# Patient Record
Sex: Female | Born: 1950 | Race: White | Hispanic: No | Marital: Married | Smoking: Former smoker
Health system: Western US, Academic
[De-identification: ages and names within clinical notes are randomized; demographics above are authoritative.]

## PROBLEM LIST (undated history)

## (undated) HISTORY — PX: HYSTERECTOMY, RADICAL: SHX000829

---

## 2014-06-12 IMAGING — MG MAMMOGRAPHY SCREENING BILATERAL 3D TOMOSYNTHESIS WITH CAD
12 of 16 series · 12 of 32 positions shown · non-contrast
Comparison: 08/09/2012
BREAST DENSITY: (Level B) There are scattered areas of fibroglandular density.

MAMMOGRAPHY SCREENING 3D TOMOSYNTHESIS WITH CAD, 06/12/2014 [DATE]:
CLINICAL INDICATION: Screening exam.
TECHNIQUE: Digital mammograms and 3-D Tomosynthesis were obtained. These were
interpreted both primarily and with the aid of computer-aided detection
system.

[L CC synth-2D]
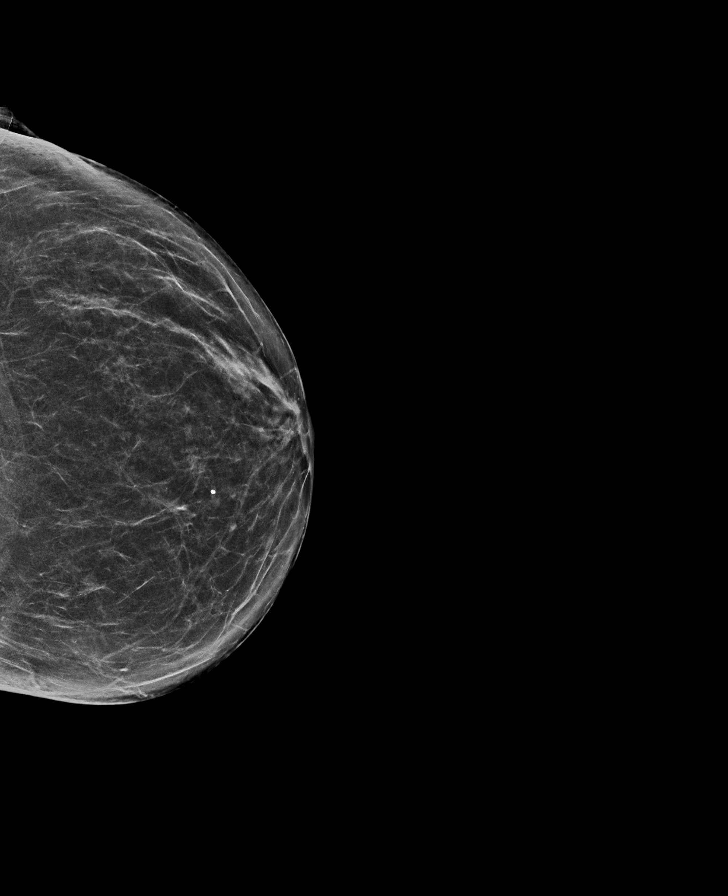

[R MLO]
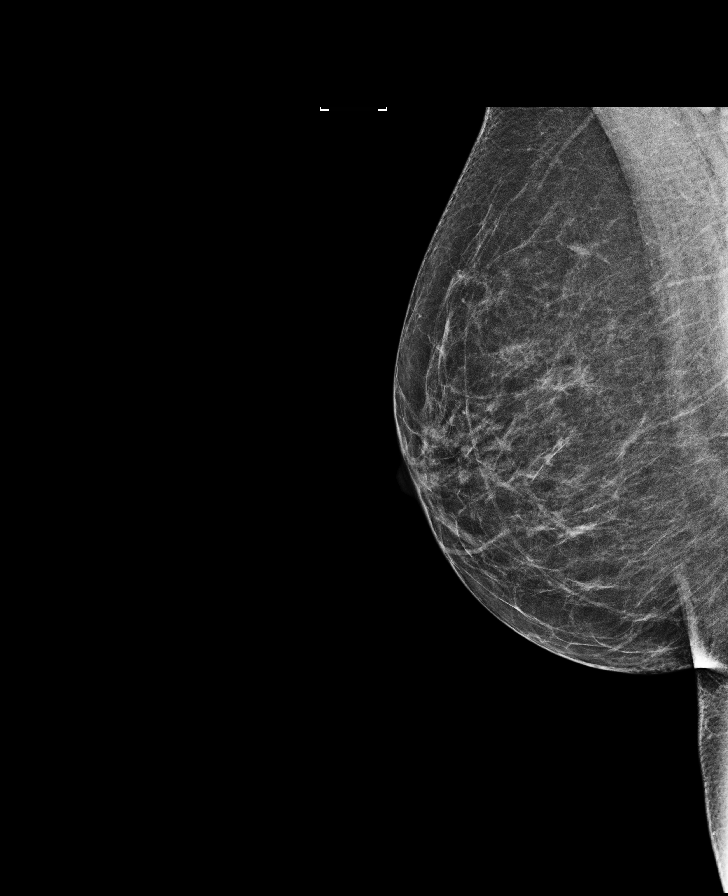

[L MLO synth-2D]
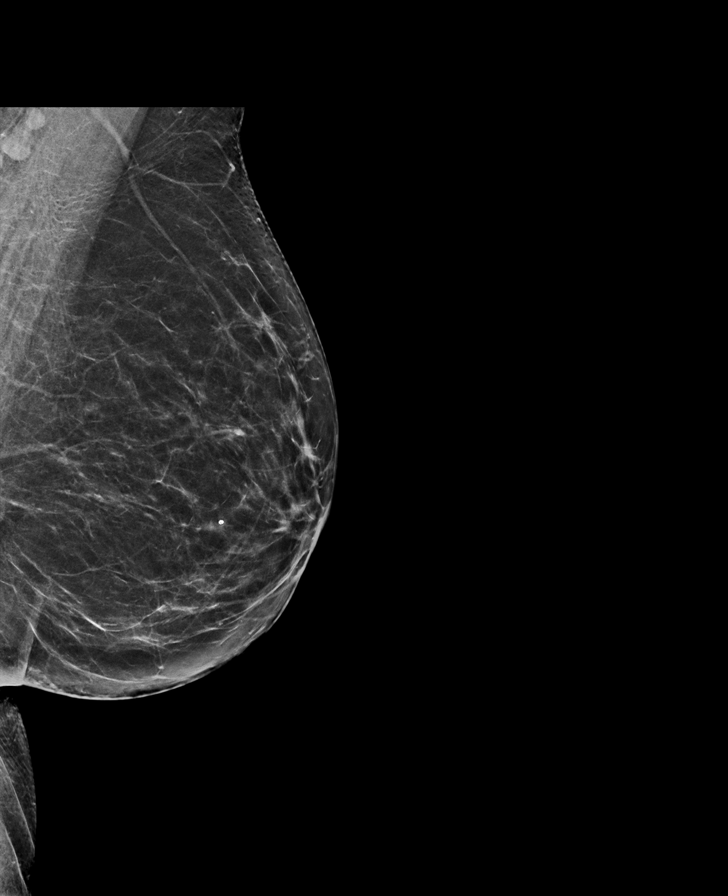

[R CC]
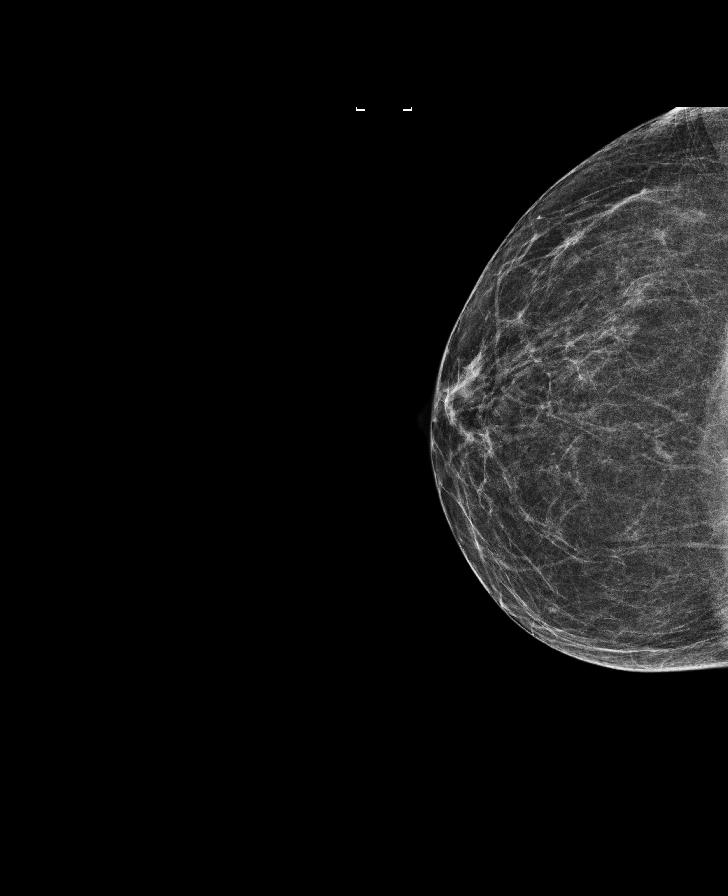

[L MLO]
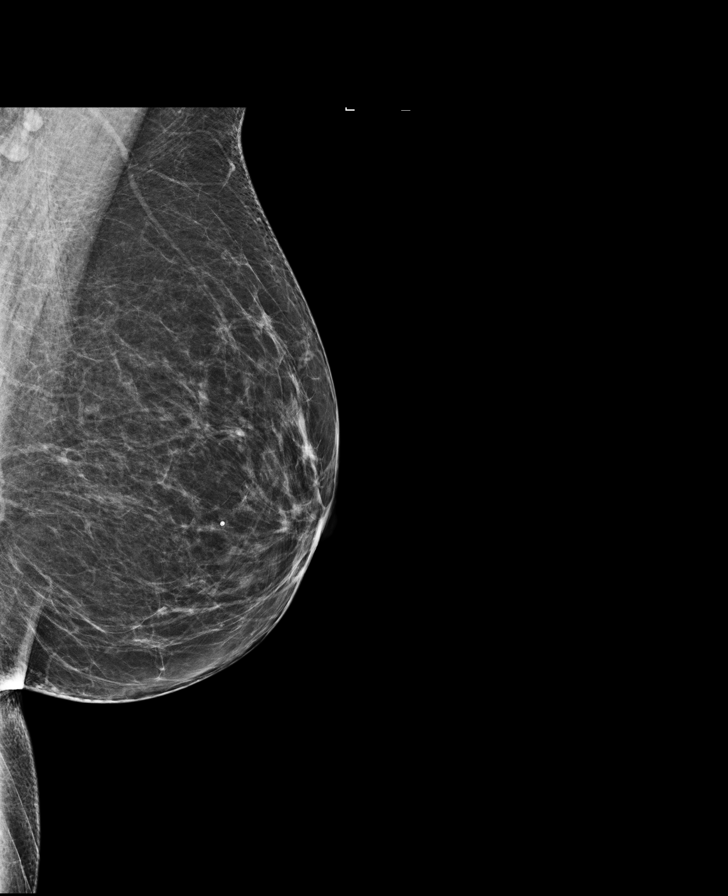

[R MLO synth-2D]
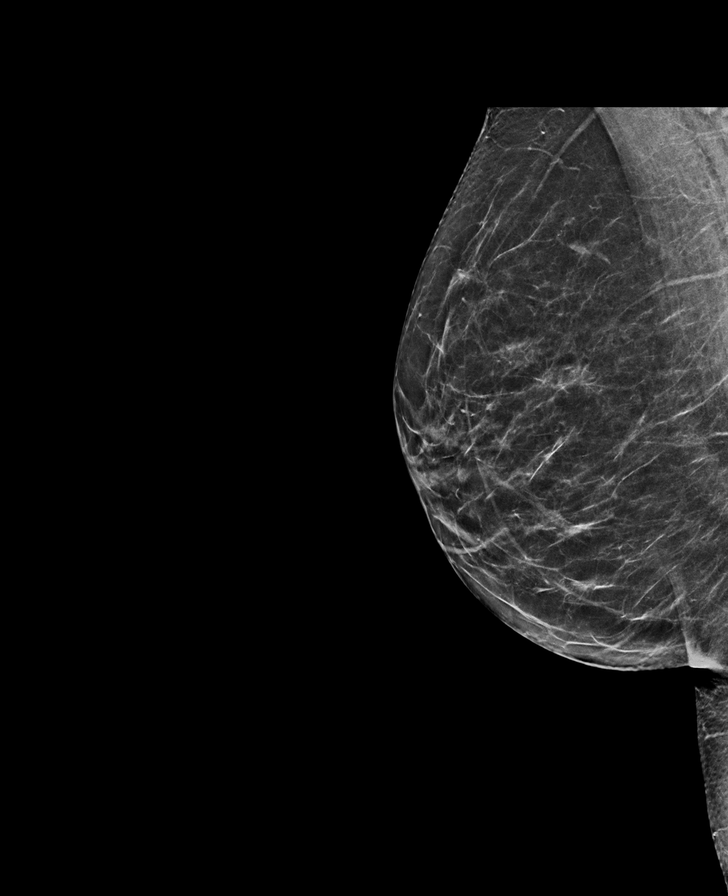

[L CC]
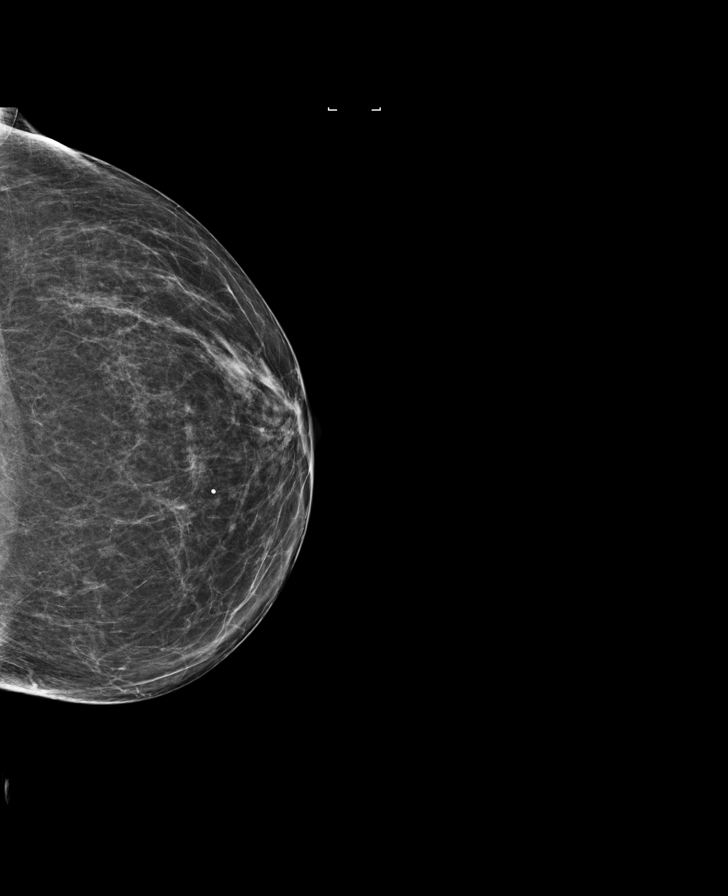

[R CC synth-2D]
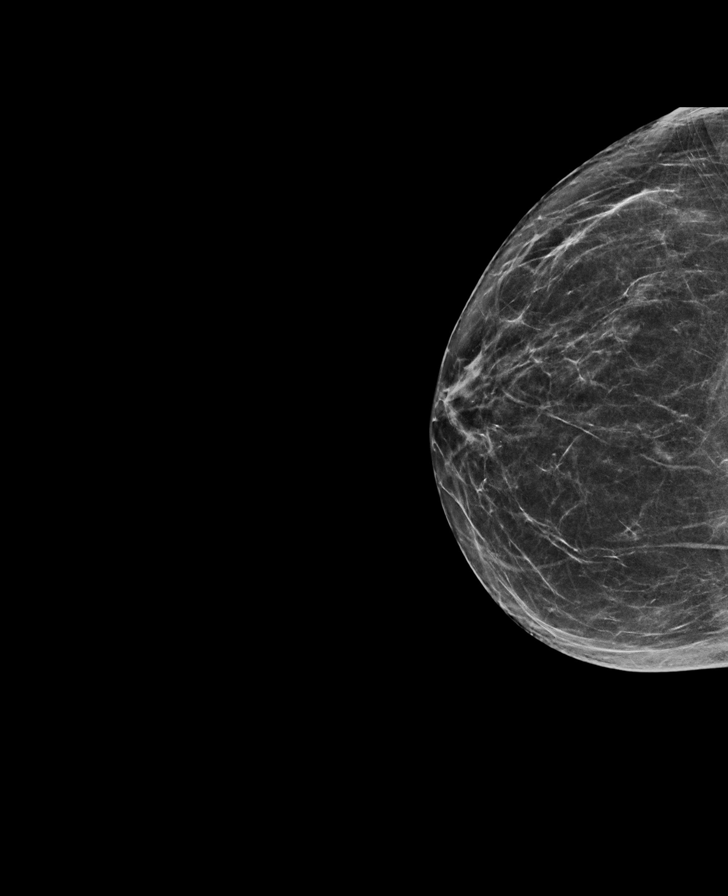

[L MLO tomo]
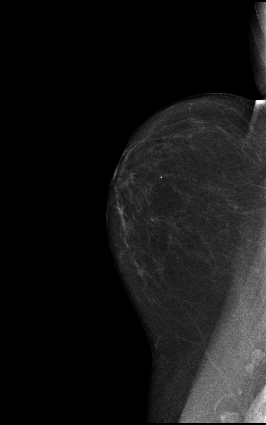

[R MLO tomo]
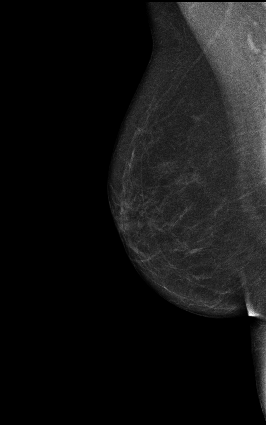

[L CC tomo]
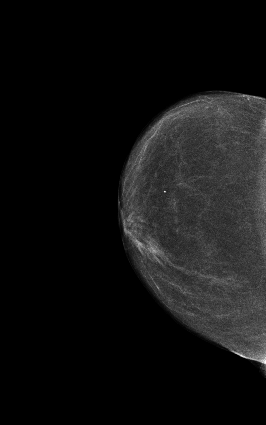

[R CC tomo]
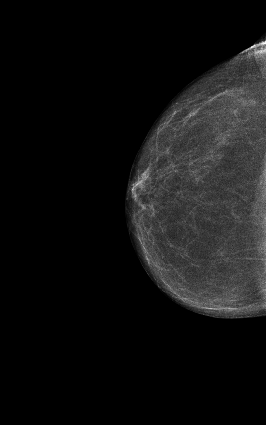

[12 of 32 positions shown; findings below may reference images not displayed]

FINDINGS: Benign type calcification left breast. No mass, area of architectural
distortion or suspicious calcification. No mammographic evidence of
malignancy.
IMPRESSION: ( BI-RADS 2) Benign findings. Routine mammographic follow-up is recommended.

## 2014-12-27 ENCOUNTER — Ambulatory Visit: Payer: 59 | Admitting: Internal Medicine

## 2014-12-27 ENCOUNTER — Encounter: Payer: Self-pay | Admitting: Internal Medicine

## 2014-12-27 VITALS — BP 112/72 | HR 72 | Temp 96.3°F | Ht 66.5 in | Wt 141.5 lb

## 2014-12-27 DIAGNOSIS — R630 Anorexia: Secondary | ICD-10-CM | POA: Insufficient documentation

## 2014-12-27 DIAGNOSIS — F418 Other specified anxiety disorders: Secondary | ICD-10-CM | POA: Insufficient documentation

## 2014-12-27 DIAGNOSIS — M810 Age-related osteoporosis without current pathological fracture: Secondary | ICD-10-CM | POA: Insufficient documentation

## 2014-12-27 DIAGNOSIS — G4700 Insomnia, unspecified: Secondary | ICD-10-CM | POA: Insufficient documentation

## 2014-12-27 DIAGNOSIS — E7800 Pure hypercholesterolemia, unspecified: Secondary | ICD-10-CM | POA: Insufficient documentation

## 2014-12-27 DIAGNOSIS — Z23 Encounter for immunization: Secondary | ICD-10-CM

## 2014-12-27 DIAGNOSIS — F4310 Post-traumatic stress disorder, unspecified: Secondary | ICD-10-CM | POA: Insufficient documentation

## 2014-12-27 MED ORDER — ALPRAZOLAM 0.25 MG TABLET
0.2500 mg | ORAL_TABLET | Freq: Every day | ORAL | 0 refills | Status: DC | PRN
Start: 2014-12-27 — End: 2015-02-12

## 2014-12-27 NOTE — Progress Notes (Signed)
IMPRESSION and PLAN:  (F41.8) Depression with anxiety  (primary encounter diagnosis)  Comment: uncontrolled but she could get into Psych right away  Plan: BusPIRone (BUSPAR) 30 mg tablet, Duloxetine         (CYMBALTA) 60 mg capsule, Alprazolam (XANAX)         0.25 mg Tablet Tablet        continue present medication(s) for now. Could consider retrying the Pristiq but she has a complicated hisptry so will defer to Psychiatry     (E78.0) High cholesterol  Comment: well controlled   Plan: Atorvastatin (LIPITOR) 20 mg Tablet        HYPERLIPIDEMIA  well controlled, no significant medication side effects noted.   current treatment plan is effective, no change in therapy, lab results and schedule of future lab studies reviewed with patient.  The patient is asked to make an attempt to improve diet and exercise patterns to aid in medical management of this problem.  I had a lengthy discussion of the side effects of the prescribed medications including serious symptoms to watch for as well as any routine laboratory monitoring required.  The patient will contact me immediately with any concerns.     (Z23) Flu vaccine need  Comment: I had a lengthy discussion of the side effects of the vaccine including serious symptoms to watch for. Minor symptoms include pain at the injection site, swelling and malaise.  The need for follow-up vaccination in 1 years was also discussed. The patient will contact me immediately with any concerns.   Plan: INFLUENZA VACCINE (3 YO AND OLDER),         QUADRIVALENT, NO LATEX, NO PRESERVATIVE          (F43.10) PTSD (post-traumatic stress disorder)    (R63.0) Anorexia    (G47.00) Insomnia  Comment: The problem of recurrent insomnia is discussed. Avoidance of caffeine sources is strongly encouraged. Sleep hygiene issues are reviewed. The use of sedative hypnotics for temporary relief is appropriate; we discussed the addictive nature of these drugs, and a one-time only prescription for prn use of a  hypnotic is given, to use no more than 3 times per week for 2-3 weeks.   Plan: advised low dose Xanax at night     (M81.0) Osteoporosis  Comment: Calcium intake, Vitamin D and supplementation was discussed with the patient and the importance of this in preventing osteoporosis and for bone health. Has been on Fosamax and Prolia    FOLLOW UP 1 month  Total time of visit 40 minutes greater than 50% of time was spent counseling about:  Depression with anxiety  (primary encounter diagnosis)  High cholesterol  Flu vaccine need.   The patient verbalizes understanding and agrees with the plan of care.   Patient does have access to My Chart.  History reviewed and updated.  Barriers to Learning assessed: none. Patient verbalizes understanding of teaching and instructions. An after visit summary was provided to the patient. The patient was instructed in all aspects of care and expressed comprehension.   Education given to the patient regarding diagnosis and follow-up.     Electronically signed by:  Bethena Midget, M.D.  [place]

## 2014-12-27 NOTE — Progress Notes (Signed)
Denise Mendez is a 64yr old female    Chief Complaint   Patient presents with    Establish Care    Rash     f/u    Medication Problem     allergic?/ refill on xanax    Referral    Imm/Inj     She lives half the time in [place].   Has doctors in [place].    Has anxiety and depression and not controlled and needs a Nature conservation officer.  Has PTSD and an eating disorder. Had done well on Pristiq but may have developed a rash on the Breasts and Torso. Her rash started after her Pristiq was increased to 10 mg but they weren't sure if the rash was something else.     She is here for follow up of depression. She has been taking Buspar and Cymbalta and  with no side-effects.  Overall, she has moderate symptoms that are gradually worsening. She is experiencing depressed mood, anhedonia, feelings of hopelessness/worthlessess, anxious mood, fatigue/sleepiness and difficulty concentrating, and she reports that none are improved. Psychosocial factors include: family/personal problems, stress. she takes medications regularly and no intolerable side effects noted. Side effects: none.  Overall, she is dissatisfied with current plan and requests a referral to psychiatry. Denies suicidal ideation or intent to harm self or others. The patient is seeing a Social worker. She is contact with her therapist in [place]. Weekly.     She is here to follow up hyperlipidemia.  Cardiovascular risk analysis - 64yr female LDL goal is under 130, nondiabetic, no existing CAD, no hypertension, former smoker, nonobese.   ROS: taking medications as instructed, no medication side effects noted, no TIA's, no chest pain on exertion, no dyspnea on exertion, no swelling of ankles.   New concerns: none.    She presents for follow up of osteoporosis. Recent DEXA scan reviewed with patient.  She takes 0 miligrams of Calcium each day and D. She is having no side-effects from her medication. Family history of osteoporosis is negative.  She does do weight bearing  exercise. Dietary Calcium intake: not much. Calcium supplementation: 0 mg. Vitamin D supplementation 2000 units.  Fall risk: low. Recent falls: none.     Social History   Substance Use Topics    Smoking status: Former Smoker    Smokeless tobacco: Not on file    Alcohol use Yes      OBJECTIVE:  BP 112/72  Pulse 72  Temp (!) 35.7 C (96.3 F) (Temporal)  Ht 1.689 m (5' 6.5")  Wt 64.2 kg (141 lb 8.6 oz)  BMI 22.5 kg/m2  Appearance - in no apparent distress, well developed and well nourished, non-toxic, in no respiratory distress and acyanotic, alert, oriented times 3, well groomed and dressed, normal vitals and well hydrated.  Chest Exam - Chest is clear, no wheezing, rhonchi or rales. Normal symmetric air entry throughout both lung fields. No chest wall deformities or tenderness.  Cardiac Exam - S1 and S2 normal, no murmurs, clicks, gallops or rubs. Regular rate and rhythm. Normal PMI. No edema or JVD.   Mental status exam; she is alert, orient to time, person and place. Normal thought content, speech, affect, mood and dress are noted.     IMPRESSION and PLAN:  (F41.8) Depression with anxiety  (primary encounter diagnosis)  Comment: better but situation is poor right now and she will call them right away  Plan: BusPIRone (BUSPAR) 30 mg tablet, Duloxetine         (  CYMBALTA) 60 mg capsule, Alprazolam (XANAX)         0.25 mg Tablet Tablet        continue present medication(s) Call or return to clinic prn if these symptoms worsen or fail to improve as anticipated.     (E78.0) High cholesterol  Comment: well controlled   Plan: Atorvastatin (LIPITOR) 20 mg Tablet        HYPERLIPIDEMIA  well controlled, no significant medication side effects noted.   current treatment plan is effective, no change in therapy, lab results and schedule of future lab studies reviewed with patient.  The patient is asked to make an attempt to improve diet and exercise patterns to aid in medical management of this problem.  I had a lengthy  discussion of the side effects of the prescribed medications including serious symptoms to watch for as well as any routine laboratory monitoring required.  The patient will contact me immediately with any concerns.     (Z23) Flu vaccine need  Comment: I had a lengthy discussion of the side effects of the vaccine including serious symptoms to watch for. Minor symptoms include pain at the injection site, swelling and malaise.  The need for follow-up vaccination in 1 years was also discussed. The patient will contact me immediately with any concerns.   Plan: INFLUENZA VACCINE (3 YO AND OLDER),         QUADRIVALENT, NO LATEX, NO PRESERVATIVE          FOLLOW UP 64 month  Total time of visit 25 minutes greater than 50% of time was spent counseling about:  Depression with anxiety  (primary encounter diagnosis)  High cholesterol  Flu vaccine need.   The patient verbalizes understanding and agrees with the plan of care.   Patient does have access to My Chart.  History reviewed and updated.  Barriers to Learning assessed: none. Patient verbalizes understanding of teaching and instructions. An after visit summary was provided to the patient. The patient was instructed in all aspects of care and expressed comprehension.   Education given to the patient regarding diagnosis and follow-up.     Electronically signed by:  Bethena Midget, M.D.  [place]

## 2014-12-27 NOTE — Nursing Note (Signed)
Dr. Redmond Pulling  verified correct dose of Flu prior to injection. Verified name, DOB, MRN and/or address before administering medication/ injection to the patient .VIS given to the patient prior to administering med/inj all question answered. Golden Hurter, MA    The Influenza Vaccine VIS document for the flu injection was given to patient to review. Patient or person named in permission has answered no to any history of egg allergy, previous serious reaction to a influenza vaccine or current illness which would preclude them receiving an immunization. Any questions were referred to the physician. The Influenza Vaccine was then administered per protocol. The patient was observed for immediate reactions to the vaccine per protocol. None were observed.   Golden Hurter, MA

## 2014-12-27 NOTE — Nursing Note (Signed)
Patient identity confirmed using two identifiers.Vital signs taken, allergies verified, screened for pain.  Laverna Dossett, MA

## 2014-12-27 NOTE — Patient Instructions (Signed)
Dr. Jiles Garter  Psychiatrist and Psychotherapist  [address]  [place], [place] [address]  Phone: [phone]  Fax: [phone]    Dr. Ronny Flurry  Phone: [phone]   Fax: [phone]

## 2015-01-22 ENCOUNTER — Ambulatory Visit: Payer: 59 | Admitting: Internal Medicine

## 2015-02-12 ENCOUNTER — Ambulatory Visit: Payer: 59 | Admitting: Internal Medicine

## 2015-02-12 ENCOUNTER — Other Ambulatory Visit: Payer: Self-pay | Admitting: Internal Medicine

## 2015-02-12 VITALS — BP 122/82 | HR 60 | Temp 97.2°F | Wt 138.9 lb

## 2015-02-12 DIAGNOSIS — F418 Other specified anxiety disorders: Secondary | ICD-10-CM

## 2015-02-12 DIAGNOSIS — K098 Other cysts of oral region, not elsewhere classified: Principal | ICD-10-CM

## 2015-02-12 MED ORDER — ALPRAZOLAM 0.25 MG TABLET
0.2500 mg | ORAL_TABLET | Freq: Three times a day (TID) | ORAL | 0 refills | Status: DC
Start: 2015-02-12 — End: 2015-05-11

## 2015-02-12 NOTE — Nursing Note (Signed)
Patient identity confirmed using two identifiers.Vital signs taken, allergies verified, screened for pain.  Valrie Jia, MA

## 2015-02-12 NOTE — Progress Notes (Signed)
Denise Mendez is a 64yr old female    Chief Complaint   Patient presents with    Other     f/u    Cyst     referral, cyst on lower lip    Refill Request     xanax     Patient Active Problem List   Diagnosis    Depression with anxiety    PTSD (post-traumatic stress disorder)    Anorexia    Osteoporosis    Insomnia    High cholesterol      Saw Dr. Ronny Flurry and she wanted to change everything around and felt she is Bipolar.    Has anxiety and depression and not controlled and needs a Nature conservation officer.  Has PTSD and an eating disorder. Had done well on Pristiq but may have developed a rash on the Breasts and Torso. Her rash started after her Pristiq was increased to 10 mg but they weren't sure if the rash was something else.     She is here for follow up of depression. She has been taking Buspar and Cymbalta and  with no side-effects.  Overall, she has moderate symptoms that are unchanged. She is experiencing depressed mood, anhedonia, feelings of hopelessness/worthlessess, anxious mood, fatigue/sleepiness and difficulty concentrating, and she reports that none are improved. Is more depressed. But is in constant communication with her therapist.  Psychosocial factors include: family/personal problems, stress. she takes medications regularly and no intolerable side effects noted. Side effects: none.  Overall, she is dissatisfied with current plan and requests a referral to psychiatry. Denies suicidal ideation or intent to harm self or others. The patient is seeing a Social worker. She is contact with her therapist in [place].     Has a cyst on her buccal mucosa that needs to be removed.     Social History   Substance Use Topics    Smoking status: Former Smoker    Smokeless tobacco: Not on file    Alcohol use Yes      OBJECTIVE:  BP 122/82  Pulse 60  Temp 36.2 C (97.2 F) (Temporal)  Wt 63 kg (138 lb 14.2 oz)  BMI 22.08 kg/m2  Appearance - in no apparent distress, well developed and well nourished, non-toxic, in  no respiratory distress and acyanotic, alert, oriented times 3, well groomed and dressed, normal vitals and well hydrated.  Cyst inner left lip  Mental status exam; she is alert, orient to time, person and place. Normal thought content, speech, affect, mood and dress are noted.     IMPRESSION and PLAN:  (K09.8) Cyst, dermoid, mouth  (primary encounter diagnosis)  Comment: desires excision  Plan: ENT CLINIC REFERRAL        Call or return to clinic prn if these symptoms worsen or fail to improve as anticipated.     (F41.8) Depression with anxiety  Comment: continue present medication(s)   Plan: Alprazolam (XANAX) 0.25 mg Tablet Tablet        She is going to call other Psychiatry     FOLLOW UP prn   Total time of visit 15 minutes greater than 50% of time was spent counseling about:  Cyst, dermoid, mouth  (primary encounter diagnosis)  Depression with anxiety.   The patient verbalizes understanding and agrees with the plan of care.   Patient does have access to My Chart.  History reviewed and updated.  Barriers to Learning assessed: none. Patient verbalizes understanding of teaching and instructions. An after visit summary was provided to  the patient. The patient was instructed in all aspects of care and expressed comprehension.   Education given to the patient regarding diagnosis and follow-up.     Electronically signed by:  Bethena Midget, M.D.  [place]

## 2015-02-13 ENCOUNTER — Telehealth: Payer: Self-pay | Admitting: Otolaryngology

## 2015-02-13 NOTE — Telephone Encounter (Signed)
Patient returning call to scheduled ENT appt. She can be reached at [phone]

## 2015-02-13 NOTE — Telephone Encounter (Signed)
Called patient and scheduled     Andris Baumann CPC-A  Referral Coordinator   [facility]  Direct Number [phone]  Main Number [phone]  Fax Number [phone]

## 2015-03-11 ENCOUNTER — Ambulatory Visit: Payer: 59 | Attending: Anatomic Pathology & Clinical Pathology | Admitting: Otolaryngology

## 2015-03-11 VITALS — BP 142/94 | HR 87 | Temp 97.6°F | Resp 16 | Ht 66.5 in | Wt 139.8 lb

## 2015-03-11 DIAGNOSIS — D100 Benign neoplasm of lip: Principal | ICD-10-CM | POA: Insufficient documentation

## 2015-03-11 DIAGNOSIS — D219 Benign neoplasm of connective and other soft tissue, unspecified: Secondary | ICD-10-CM

## 2015-03-11 DIAGNOSIS — K130 Diseases of lips: Secondary | ICD-10-CM | POA: Insufficient documentation

## 2015-03-11 NOTE — Nursing Note (Signed)
Vital signs taken, pain screened, allergies reviewed, medications reviewed, and pharmacy information obtained. Briauna Gilmartin LVN

## 2015-03-11 NOTE — Addendum Note (Signed)
Addended by: Esaw Dace on: 03/11/2015 02:42 PM     Modules accepted: Orders

## 2015-03-11 NOTE — Progress Notes (Signed)
PATIENT: Denise Mendez LOCATIONDimitri Ped   MR #: [id number] SEX: female  AGE: 81yr  DATE OF SERVICE: 03/11/2015 DOB: 08/07/1950    OTOLARYNGOLOGY NEW PATIENT CLINIC NOTE    CONSULTATION REQUESTED BY:  Tito Dine, MD    03/11/2015   Dear Tito Dine, MD:    I had the absolute pleasure of seeing Denise Mendez in the Otolaryngology, Head & Neck Surgery Clinic at the [place] [facility] [place], [place] in initial evaluation.  Thank you for your kind referral of this pleasant patient. Please see my full evaluation as noted below:    CHIEF COMPLAINT: Mouth cyst    HISTORY OF PRESENT ILLNESS:  Denise Mendez is a 58yr year old female referred by PCP, Tito Dine, MD for mouth cyst.    Patient was in her usual state of health until August when she noted a small cyst in her mouth. She was visiting her son in [place] at the time and saw a doctor there who recommended that she follow-up with ENT if the cyst does not resolve in the next several months. No ulcerations or growths on the tongue, floor of the mouth,  palate. No trismus. No articulation defects. No problems with occlusion. No loose teeth, dental pain or swelling or bleeding from gums. No pain or numbness of tongue, gums or buccal mucosa. No disturbance of taste. No bleeding from tongue or floor of mouth.    Notes that she clenches her jaw at night due to stress.     Paternal history of soft palate cancer.    Takes 81mg  of Aspirin daily for mitral valve prolapse and ASD.     Otherwise doing well.     PAST MEDICAL HISTORY.  No past medical history on file.    PAST SURGICAL HISTORY  Past Surgical History   Procedure Laterality Date    Hysterectomy radical         ALLERGIES  Review of patient's allergies indicates no known allergies.    MEDICATIONS  Current Outpatient Prescriptions   Medication Sig    Alprazolam (XANAX) 0.25 mg Tablet Tablet Take 1 tablet by mouth 3 times daily.    Aspirin 81 mg DR Tablet EC Tablet Take 1 tablet by  mouth every day.    Atorvastatin (LIPITOR) 20 mg Tablet Take 1 tablet by mouth every day.    BusPIRone (BUSPAR) 30 mg tablet Take 1 tablet by mouth 2 times daily.    Duloxetine (CYMBALTA) 60 mg capsule Take 1 capsule by mouth every day.     No current facility-administered medications for this visit.        SOCIAL HISTORY:  Social History   Substance Use Topics    Smoking status: Former Smoker    Smokeless tobacco: Not on file    Alcohol use Yes       FAMILY HISTORY  Review of patient's family history indicates:    Psychiatric Disease/Mental Il* Mother                      REVIEW OF 14 SYSTEMS:  This was reviewed in the office today.  Please see above HPI for specific findings. All other systems were negative unless otherwise documented in their scanned sheet or as noted below.     Additional ENT ROS performed as follows:  Oral Cavity: See HPI    Nasopharynx: No pain, bleeding or postnasal discharge.    Oropharynx: No dysphagia, odynophagia, sore throat, foreign body sensation or bleeding.  Hypopharynx and Larynx: No mid-neck pain, no sticking of food, no hoarseness or any other change in voice quality. No cough or hemoptysis. No airway obstruction.     Neck: No appreciation of neck masses.    Nose and Sinuses: No nasal or facial pain. No epistaxis or nasal drainage. No nasal obstruction. No nasal or facial deformity. No feelings of pressure or congestion. No alteration of sense of smell.    Eyes and Lacrimal System: No alteration in visual acuity. No diplopia. No eye pain. No sense of proptosis. No epiphora. No masses in lids or periorbital area. No abnormality of lid appearance.    Salivary Glands: No masses, pain, gritty sensation, pus or blood in saliva.    Ear and Vestibular System: No otorrhea, otalgia, hearing loss, tinnitus or vertigo. No ear numbness, fullness in ears.    Cranial Nerves: No facial numbness, facial weakness, weakness in mastication, shoulder weakness or dysarthria.     Past medical  history, past surgical history, drug allergies, medications, and pertinent social and family medical history were reviewed with the patient, and details of these were providedare in the Otolaryngology  patient questionnaire. This was reviewed and verified today.    PHYSICAL EXAMINATION:  BP (!) 142/94  Pulse 87  Temp 36.4 C (97.6 F) (Tympanic)  Resp 16  Ht 1.689 m (5' 6.5")  Wt 63.4 kg (139 lb 12.4 oz)  SpO2 100%  BMI 22.22 kg/m2   General/Constitutional: Patient is a well-nourished, well-developed female appearing stated age in no acute distress. Voice quality is good.     Psych: Alert & oriented to time, place and person. Answers questions appropriately.    Skin/scalp : Normal and without lesions. No rashes, ulcerations or masses noted.    Head: Normocephalic. No asymmetries noted. No tenderness to palpation of the soft tissue or bony structures of the face or skull.    Eyes: Normal extraocular motions without diplopia.  Pupils equal round and reactive to light.  No nystagmus noted.    ENT:  Mouth: Teeth, gums, tongue, floor of the mouth, retromolar trigone, hard palate - normal without lesions. Normal jaw opening. Occlusion - normal. 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 Small area of hemorrhage on the surface.    Oropharynx: pharyngeal walls, base of tongue - normal. No bleeding or masses. Tonsils absent.     Neck/Musculoskeletal: Supple. No masses. No areas of tenderness.  Nonpalpable thyroid. No limitations in flexion/extension.    Lymph: No cervical lymphadenopathy, No pre or post-auricular lymphadenopathy.    Cranial Nerves/Neuro: Function II through XII - normal. No focal deficits noted.    Procedure:    MEDICAL DECISION MAKING  IMPRESSION:  1. Fibroma of lip    2. Lesion of lip         RECOMMENDATIONS:  -- We discussed exam findings of 3mm traumatic fibroma on the oral wet lip mucosa. I have recommended proceeding with an excision, patient would like to proceed. Consent was signed  in clinic and risks were reviewed in detail including pain, bleeding, infection, scarring, numbing, recurrence, and failure of procedure to resolve condition. See separate procedure note. Will call with results once back. F/u prn unless issues arise.    All questions were answered. Patient is amenable to plan as discussed above.    A total of 45 minutes were spent with patient, of which more than 50 percent of our face-to-face time was spent providing counseling or coordination of care on the above  recommendations including: probable diagnosis,  probable treatment options, alternatives, plan of care, reasons to return, expected outcomes.        No orders of the defined types were placed in this encounter.      SCRIBE DISCLAIMER:  This note was scribed by Elvia Collum, a trained medical scribe, in the  presence of Lane D. Lanell Persons, MD.      PROVIDER DISCLAIMER:  This document serves as my personal record of services taken in my presence. It was created on 03/11/2015 on my behalf by Elvia Collum, a trained medical scribe.    I have reviewed this document and agree that this note accurately reflects the history and exam findings, the patient care provided, and my medical decision making.    Thank you for this kind consultation of this patient to our department, and we feel honored to be of service. Please feel free to contact me at any point with to discuss or answer any questions pertaining to this patient or any other matter.     Sincerely,      Morene Rankins. Lanell Persons, MD   Assistant Professor, Clinical Otolaryngology  Dept. of Otolaryngology, Head & Neck Surgery  03/11/2015   PI#: [id number]   Personal Pager [phone]  [place], [facility])  [address], [id number]  [place], [place] [address]

## 2015-03-11 NOTE — Procedures (Signed)
DATE: 03/11/2015   TIME: 14:23     PROCEDURE NOTE:    Pre-procedure diagnosis: Traumatic lip fibroma  Pain before procedure: no  Location: Left lower lip    Consent form completed and signed by patient    Site(s) verbally verified with patient  Sites: Lower lip    Interpreter used? no    Pre-procedure medication: None  Prep: Betadine swab   Anesthesia: 1% Lidocaine with 1:100000 epi with 1:10 dilution of 8.4% bicarb solution    Summary of procedure/findings: 57mm fibroma excised with surrounding mucosa.    Description of Procedure:  The patient was identified in the procedure area and consent confirmed in the chart. We performed a huddle to confirm the patient's name medical number and procedure to be performed.After all were in agreement, including the patient, we then proceeded to inject 1% lidocaine with 1:100000 Epi buffered in 8.4% solution (~1 mls used). Patient was then prepped in a clean fashion.    Iris scissors and forceps were used to removed the specimen at the surrounding normal mucosa at the base.  Silver nitrate cautery was performed to confirm hemostasis. Single 5-0 chromic suture was placed.     Status of patient at end of procedure: Good  Post-procedure diagnosis: Same    Barriers to Learning: none    Post procedure instructions discussed. Patient/caregiver understands.  Handouts: Provided with wound care instructions    Suture: as above  Dressing: None  Pathology/Cytology submitted: yes  Estimated blood loss: 16ml  Complications: None  Post-procedure medications: None  Follow up appointment: Will call with results  Time of discharge:    For procedures typically performed in five minutes or less, Federal/State law requires the teaching physician to be present during the entire procedure.  ATTENDING PHYSICIAN: I performed the entire procedure.        Morene Rankins. Lanell Persons, MD   Assistant Professor, Clinical Otolaryngology  Dept. of Otolaryngology, Head & Neck Surgery  PI#: [phone]   Personal  Pager [phone]  11/26/2014  =========================

## 2015-03-13 LAB — SURGICAL PATHOLOGY

## 2015-03-18 ENCOUNTER — Telehealth: Payer: Self-pay | Admitting: Otolaryngology

## 2015-03-18 NOTE — Telephone Encounter (Signed)
Called patient to relay biopsy results. Healing well and feeling near normal now.  PRN f/u     DIAGNOSIS                  A.  LIP, LOWER (BIOPSY):       -  FIBROMA       Tariq Pernell D. Lanell Persons, MD   Assistant Professor, Clinical Otolaryngology  Dept. of Otolaryngology, Head & Neck Surgery  PI#: [phone]   Personal Pager [phone]  03/18/2015  =========================

## 2015-04-16 ENCOUNTER — Encounter: Payer: Self-pay | Admitting: Internal Medicine

## 2015-04-16 NOTE — Telephone Encounter (Signed)
From: Ella Bodo  To: Tito Dine, MD  Sent: 04/16/2015 10:58 AM PST  Subject: Non-urgent Medical Advice Question    Dr. Redmond Pulling, I have been experiencing low and upper back significant muscle spasms since last Friday. I have been using heat and rest with a back brace and I am in alot of pain and unable to sleep lying down. In the past I have been given Ultram and Flexerill for this issue. Can you call [facility] into [facility] on [facility] for me.     Thank You,    Denise Mendez

## 2015-04-16 NOTE — Telephone Encounter (Signed)
I spoke with patient.  She has never been seen for her back pain or prescribed medication.  Patient advised an appointment needed.  An appointment offered today or tomorrow.  Patient agrees with plan of care.  She has scheduled an appointment tomorrow afternoon with Dr. Arnette Norris.  [name] RN-BC

## 2015-04-17 ENCOUNTER — Ambulatory Visit (INDEPENDENT_AMBULATORY_CARE_PROVIDER_SITE_OTHER): Payer: 59

## 2015-04-17 ENCOUNTER — Ambulatory Visit: Payer: Self-pay | Admitting: Internal Medicine

## 2015-04-17 ENCOUNTER — Ambulatory Visit: Payer: 59 | Admitting: Family Medicine

## 2015-04-17 ENCOUNTER — Ambulatory Visit
Admission: RE | Admit: 2015-04-17 | Discharge: 2015-04-17 | Disposition: A | Discharge status: Home / Self Care | Payer: 59 | Source: Ambulatory Visit | Attending: Family Medicine | Admitting: Family Medicine

## 2015-04-17 VITALS — BP 132/86 | HR 84 | Temp 97.4°F | Wt 140.4 lb

## 2015-04-17 DIAGNOSIS — R0789 Other chest pain: Principal | ICD-10-CM | POA: Insufficient documentation

## 2015-04-17 LAB — TROPONIN I

## 2015-04-17 MED ORDER — BACLOFEN 10 MG TABLET
10.0000 mg | ORAL_TABLET | Freq: Three times a day (TID) | ORAL | 1 refills | Status: DC
Start: 2015-04-17 — End: 2015-05-06

## 2015-04-17 NOTE — Nursing Note (Signed)
EKG performed per doctor's order. Results reported/given to Dr. Mach.Arif Amendola, MA\

## 2015-04-17 NOTE — Progress Notes (Signed)
SUBJECTIVE:  Denise Mendez is a 65yr old female has history of PTSD. Has been under more stress. Anxiety.   December was a tough months. She will get neck and back pain that flares when she stress.   Been seeing therapist for long time.   Still taking her Buspar and Cymbalta.   Neck and back pain gets significant. Will get a spasm around the thoracic to front. Pain present now.   More severe on left side. Shoulder hurt. Moves around.   When she is bend at night it hurts. Spasm.   This is present at rest. Not exertional.     OBJECTIVE:  General Appearance: healthy, alert, no distress, pleasant affect, cooperative.  Eyes:  conjunctivae and corneas clear. PERRL, EOM's intact. Fundi benign.  Ears:  normal TMs and canal.  Nose:  normal.  Mouth: normal.  Neck:  Neck supple. No adenopathy, thyroid symmetric, normal size.  Heart:  normal rate and regular rhythm, no murmurs, clicks, or gallops.  Lungs: clear to auscultation and percussion, no chest deformities noted.  Extremities:  no cyanosis, clubbing, or edema.  Skin:  negative.    Tender over the paraspinal muscle from Cervical, thoracic, and lumbar.       ASSESSMENT:  (R07.89) Chest wall pain  (primary encounter diagnosis)  Comment:   Plan: CHEST 2 VIEWS, POC ELECTROCARDIOGRAM WITH         RHYTHM STRIP, Baclofen (LIORESAL) 10 mg Tablet,        ACUPUNCTURE REFERRAL        Does Yoga.     Barriers to Learning assessed: none. Patient verbalizes understanding of teaching and instructions.  Laurence Aly, DO

## 2015-04-17 NOTE — Nursing Note (Signed)
Patient identity confirmed using two identifiers.Vital signs taken, allergies verified, screened for pain.  Kainon Varady, MA

## 2015-04-18 ENCOUNTER — Telehealth: Payer: Self-pay | Admitting: Internal Medicine

## 2015-04-18 MED ORDER — IBUPROFEN 600 MG TABLET
600.0000 mg | ORAL_TABLET | Freq: Three times a day (TID) | ORAL | 1 refills | Status: AC | PRN
Start: 2015-04-18 — End: 2016-04-17

## 2015-04-18 NOTE — Telephone Encounter (Signed)
Please inform patient she was prescribed Baclofen yesterday for her muscle spasms. Recommend adding Motrin 600mg  po three times daily which has been sent today.     Labs Trop I was normal    CXR was normal.     Recommend for long term treatment nonpharmacologic methods to treat pain.       Laurence Aly, DO

## 2015-04-18 NOTE — Telephone Encounter (Signed)
1.  The patient states she is in a great deal of pain and is asking for medication.    2. She is calling for the results of lab test and X-rays done at [facility] yesterday.   She would like a phone call back.    Jearld Lesch  mosc II

## 2015-04-18 NOTE — Telephone Encounter (Signed)
Patient notified

## 2015-04-21 LAB — POC ELECTROCARDIOGRAM WITH RHYTHM STRIP: QTC: 423

## 2015-05-02 NOTE — Nurse Focus (Signed)
Test ordered Spect  Instructed to arrive at [facility] room 1776 at 1245  Instructed to:   Hold Caffeine for 12 hoursyes   NPO for 4 hoursyes   Clear liquids for 2 hoursyes    If patient is diabetic, instructed to hold am diabetic medicationsN/A   If on Lantus instructed to take 1/2 of usual dose.      Patient instructed to take all regular medications with the exception of any medications that may have caffeine or any phosphodiesterase Type 5 (PDE5)   inhibitor classof erectile dysfunction (ED) drugs, Cialis, Levitra, Viagra, etc. Those medications should be held for 48 hours prior to the stress exam. Yes    Instructed length of stay for PET approximately 2 hours and Spect approximately 3-4 hours.yes    Spoke with: patient  Comments: gave call back number for questions

## 2015-05-05 ENCOUNTER — Encounter (HOSPITAL_BASED_OUTPATIENT_CLINIC_OR_DEPARTMENT_OTHER)
Admission: RE | Admit: 2015-05-05 | Discharge: 2015-05-05 | Disposition: A | Discharge status: Home / Self Care | Payer: 59 | Source: Ambulatory Visit | Attending: Family Medicine | Admitting: Family Medicine

## 2015-05-05 ENCOUNTER — Encounter
Admission: RE | Admit: 2015-05-05 | Discharge: 2015-05-05 | Disposition: A | Discharge status: Home / Self Care | Payer: 59 | Source: Ambulatory Visit | Attending: Cardiovascular Disease | Admitting: Cardiovascular Disease

## 2015-05-05 DIAGNOSIS — R0789 Other chest pain: Secondary | ICD-10-CM

## 2015-05-05 MED ORDER — AMINOPHYLLINE 250 MG/10 ML INTRAVENOUS SOLUTION
75.0000 mg | INTRAVENOUS | Status: DC | PRN
Start: 2015-05-05 — End: 2015-05-11

## 2015-05-05 MED ORDER — ONDANSETRON HCL (PF) 4 MG/2 ML INJECTION SOLUTION
4.0000 mg | INTRAMUSCULAR | Status: AC | PRN
Start: 2015-05-05 — End: 2015-05-05

## 2015-05-05 MED ORDER — REGADENOSON 0.4 MG/5 ML INTRAVENOUS SYRINGE
0.4000 mg | INJECTION | Freq: Once | INTRAVENOUS | Status: AC
Start: 2015-05-05 — End: 2015-05-05
  Administered 2015-05-05: 0.4 mg via INTRAVENOUS

## 2015-05-06 ENCOUNTER — Encounter: Payer: Self-pay | Admitting: Internal Medicine

## 2015-05-06 ENCOUNTER — Other Ambulatory Visit: Payer: Self-pay | Admitting: Family Medicine

## 2015-05-06 DIAGNOSIS — R0789 Other chest pain: Principal | ICD-10-CM

## 2015-05-06 MED ORDER — CELECOXIB 200 MG CAPSULE
200.0000 mg | ORAL_CAPSULE | Freq: Every day | ORAL | 5 refills | Status: AC
Start: 2015-05-06 — End: 2015-06-03

## 2015-05-06 NOTE — Telephone Encounter (Signed)
From: Denise Mendez  To: Tito Dine, MD  Sent: 05/06/2015 12:05 PM PST  Subject: Non-urgent Medical Advice Question    Dr. Redmond Pulling yesterday I had my cardiac stress test and unfortunately due to the positioning on the table I am experiencing a severe increase in neck and low back paina and spasms. I will be asking for a refill of baclofen. Dr Arnette Norris only ordered Motrin for pain due to stomach issues I cannot take Motrin. Could you please order as pain medication to assist my pain level is out of control. I cannot stand up straight. If you have any questions please call me. I was not contacted about accupuncture until Friday and I cannot afford to go to [facility] in [place]. I have had 2 massages.

## 2015-05-11 ENCOUNTER — Other Ambulatory Visit: Payer: Self-pay | Admitting: Internal Medicine

## 2015-05-11 DIAGNOSIS — F418 Other specified anxiety disorders: Principal | ICD-10-CM

## 2015-05-15 LAB — ELECTROCARDIOGRAM WITH RHYTHM STRIP: QTC: 399

## 2015-11-19 IMAGING — MG MAMMOGRAPHY SCREENING BILATERAL 3D TOMOSYNTHESIS WITH CAD
12 of 16 series · 12 of 32 positions shown · non-contrast
Comparison: 06/12/2014 and 08/09/2012.
BREAST DENSITY: (Level B) There are scattered areas of fibroglandular density.

MAMMOGRAPHY SCREENING 3D TOMOSYNTHESIS WITH CAD, 11/19/2015 [DATE]:
CLINICAL INDICATION: Screening.
TECHNIQUE: Digital mammograms and 3-D Tomosynthesis were obtained. These were
interpreted both primarily and with the aid of computer-aided detection
system.

[L CC synth-2D]
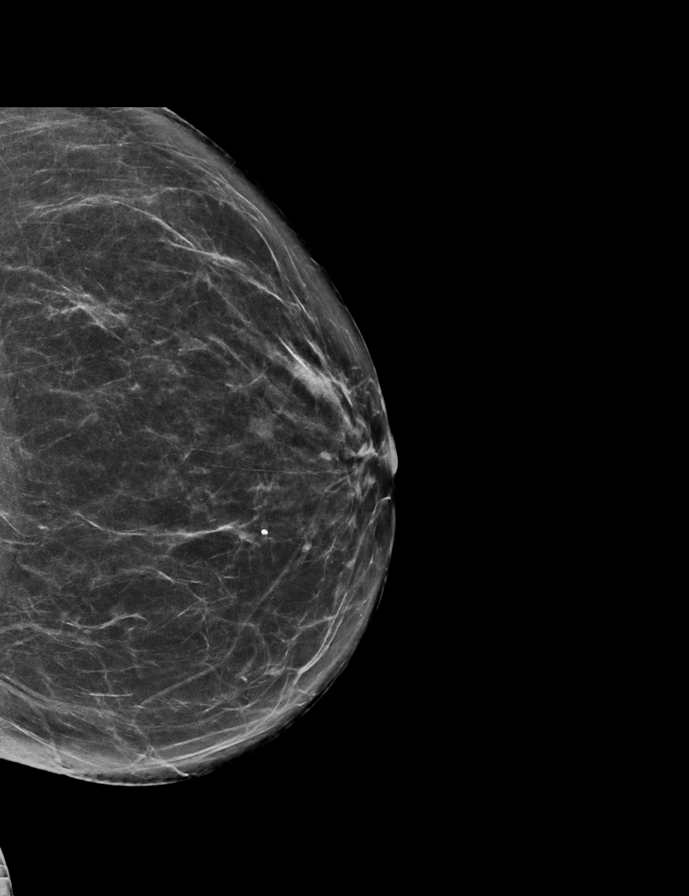

[L MLO synth-2D]
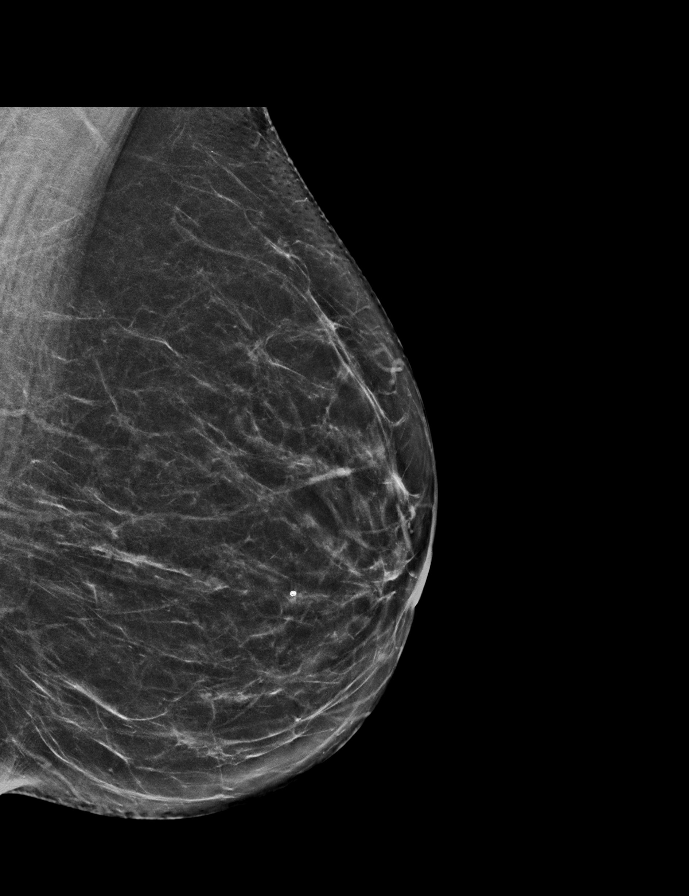

[R CC synth-2D]
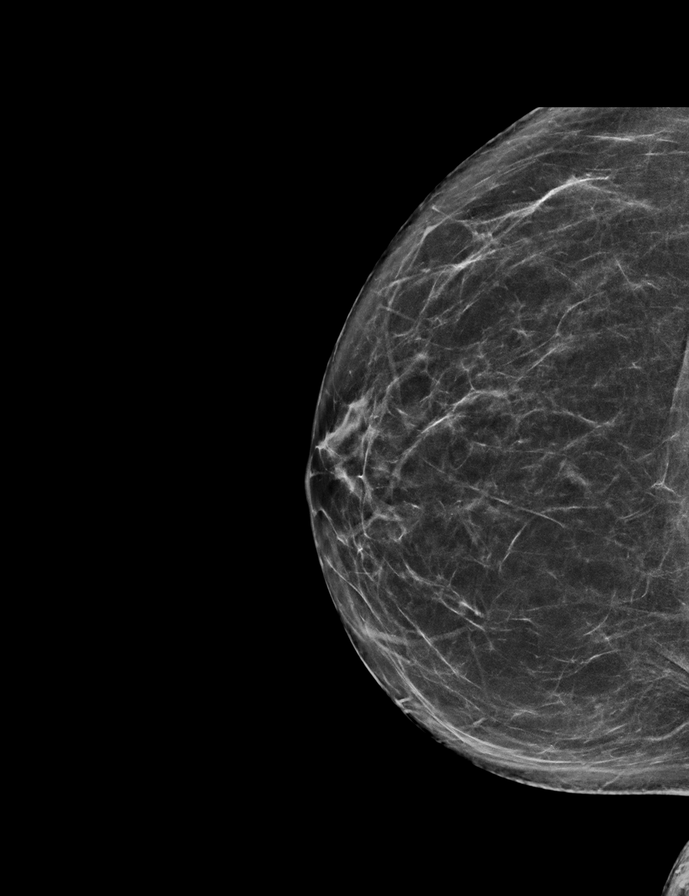

[R MLO synth-2D]
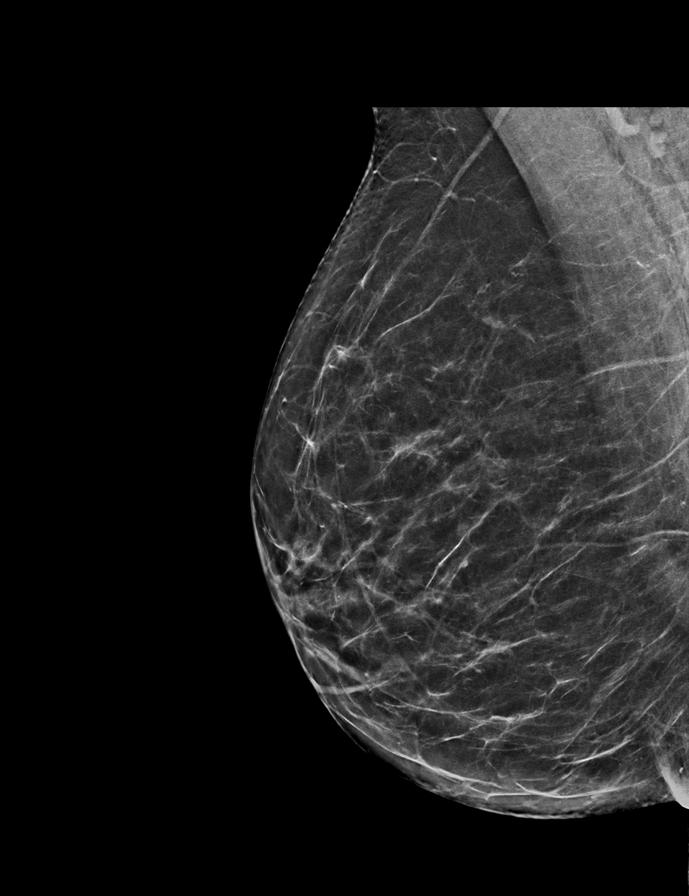

[L CC]
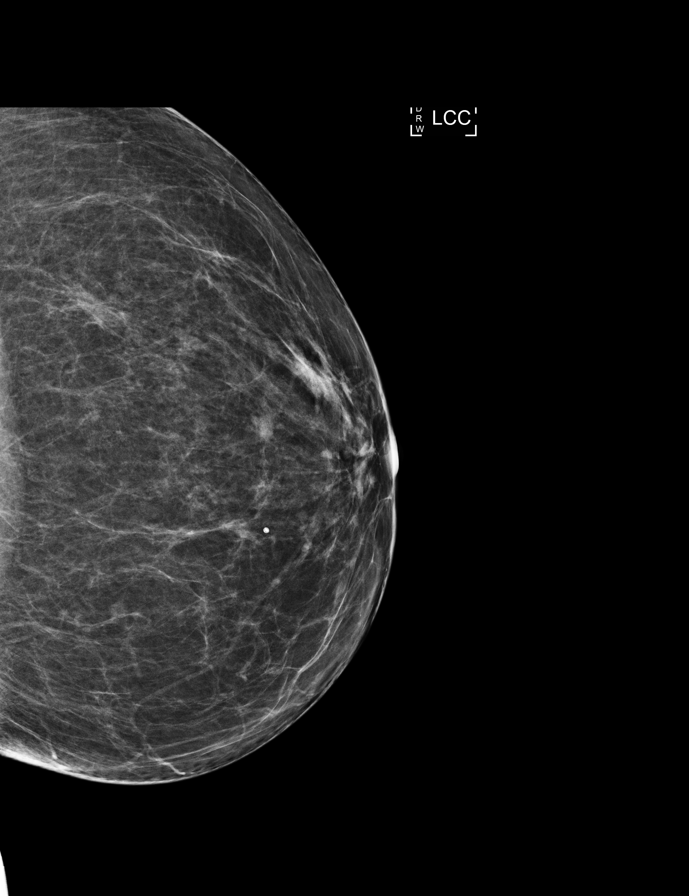

[L MLO]
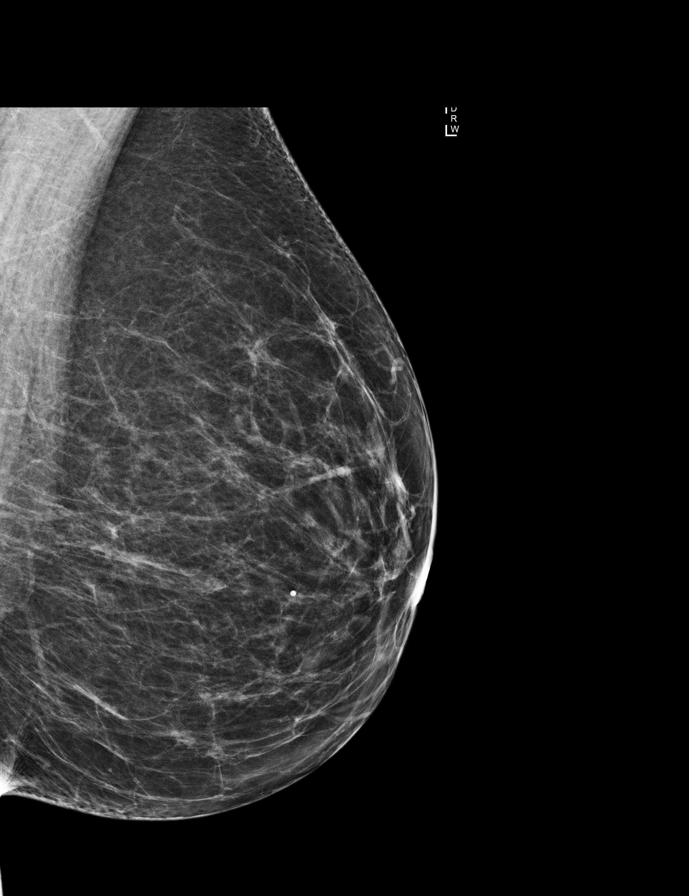

[R MLO]
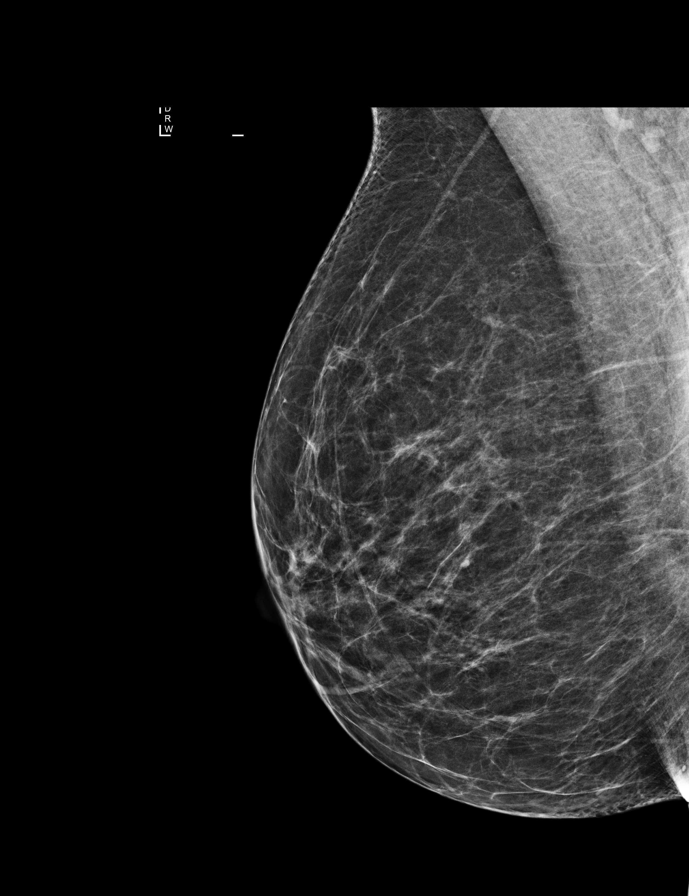

[R CC]
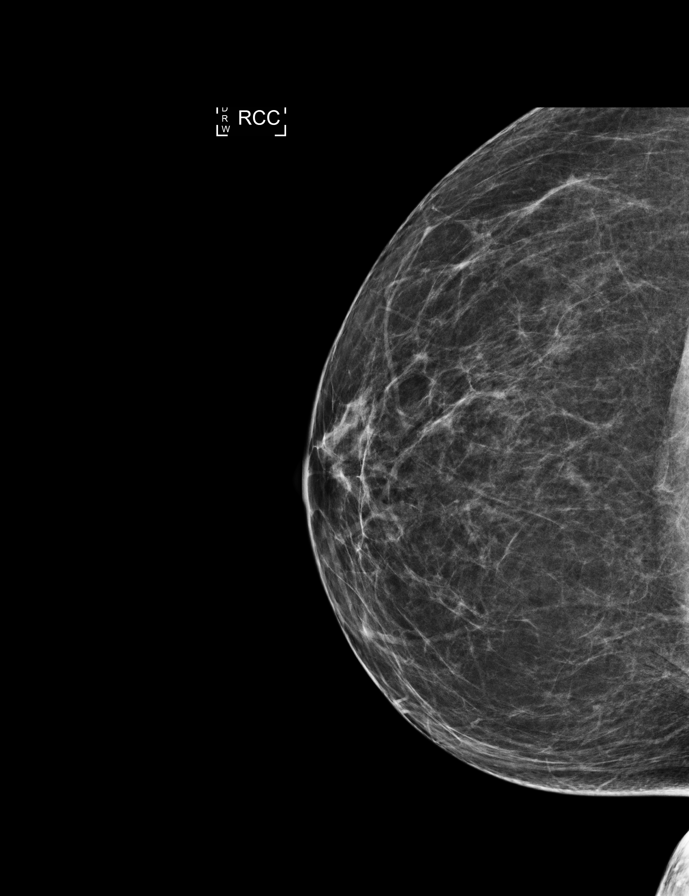

[R MLO tomo]
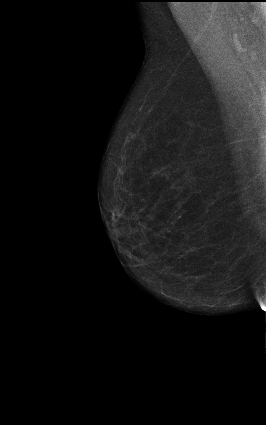

[L CC tomo]
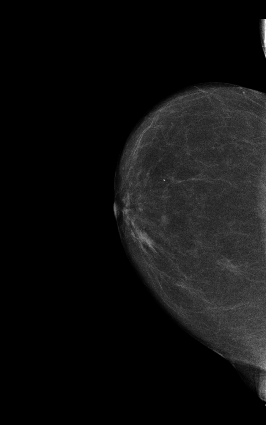

[L MLO tomo]
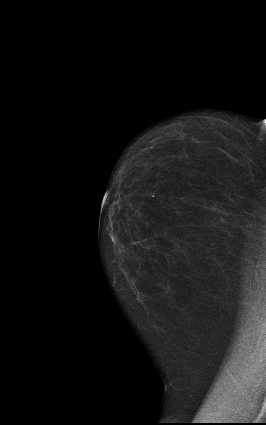

[R CC tomo]
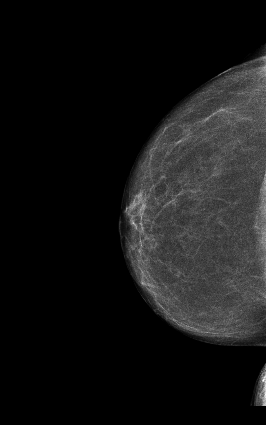

[12 of 32 positions shown; findings below may reference images not displayed]

FINDINGS: No mammographically suspicious abnormality and no significant
change.
IMPRESSION: ( BI-RADS 2) Benign findings. Routine mammographic follow-up is recommended.

## 2022-08-12 IMAGING — MG MAMMOGRAPHY SCREENING BILATERAL 3[PERSON_NAME]
8 series · 8 of 24 positions shown · non-contrast
Comparison: Plain the report was to obtain prior exams for comparison. 
Comparison is now made with 11/19/2015 and 06/12/2014.

________________________________________________________________________________________________ 
******** ADDENDUM #1 ********/n 
Comparison is now made with additional prior exams including mammogram and 
ultrasound of 06/12/2021. 
The circumscribed lesion within the right breast is unchanged, shown to 
represent a cyst or dilated duct on the ultrasound exam.
TECHNIQUE: Digital bilateral mammograms and 3-D Tomosynthesis were obtained. 
These were interpreted both primarily and with the aid of computer-aided 
detection system.  
BREAST DENSITY: (Level B) There are scattered areas of fibroglandular density.

[L MLO]
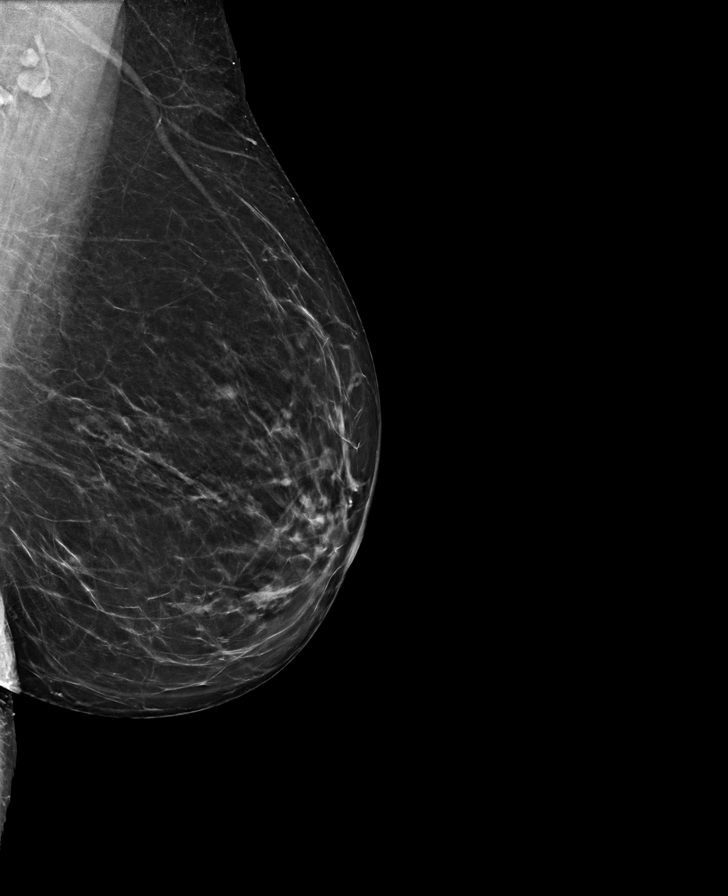

[R MLO]
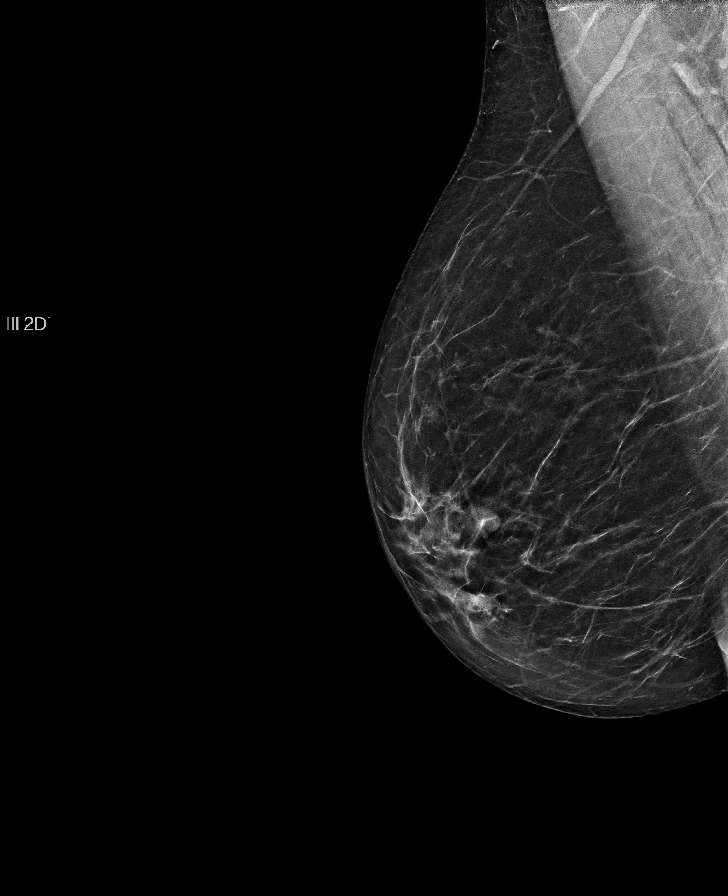

[L CC]
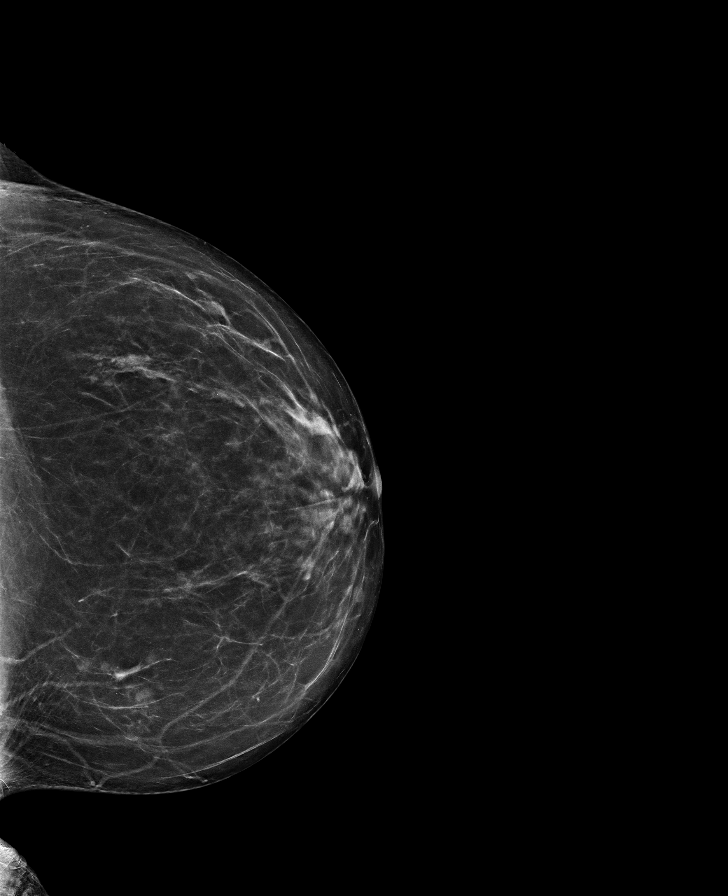

[R CC]
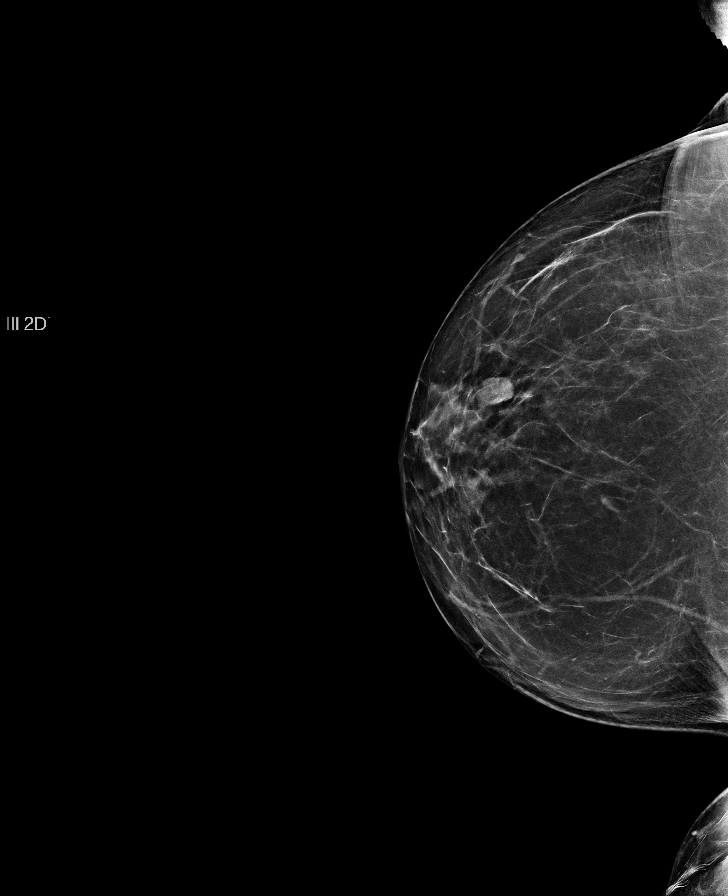

[R MLO tomo · tomo slice 35/69.0]
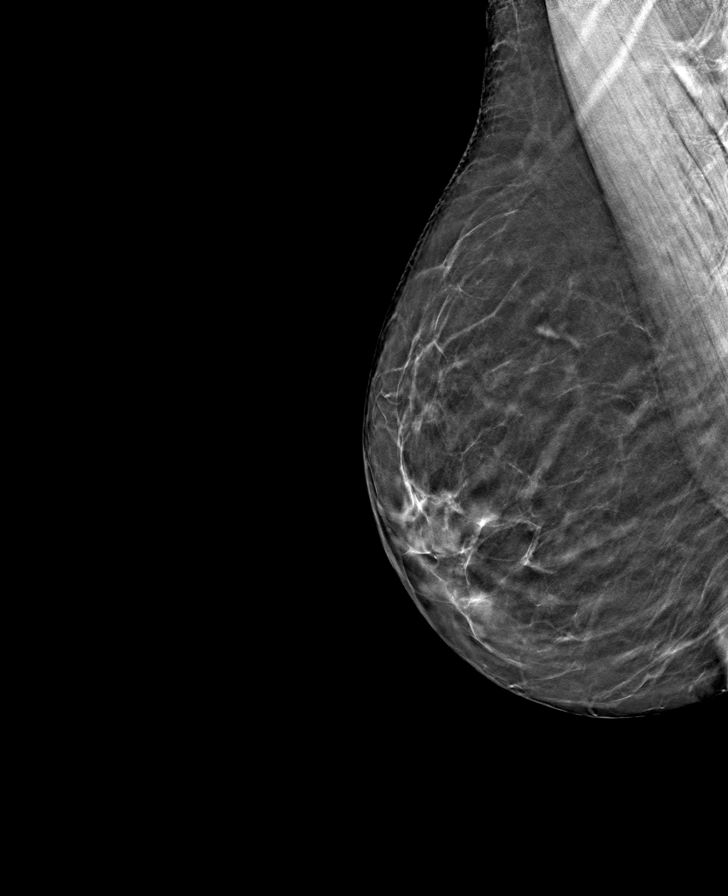

[R CC tomo · tomo slice 40/79.0]
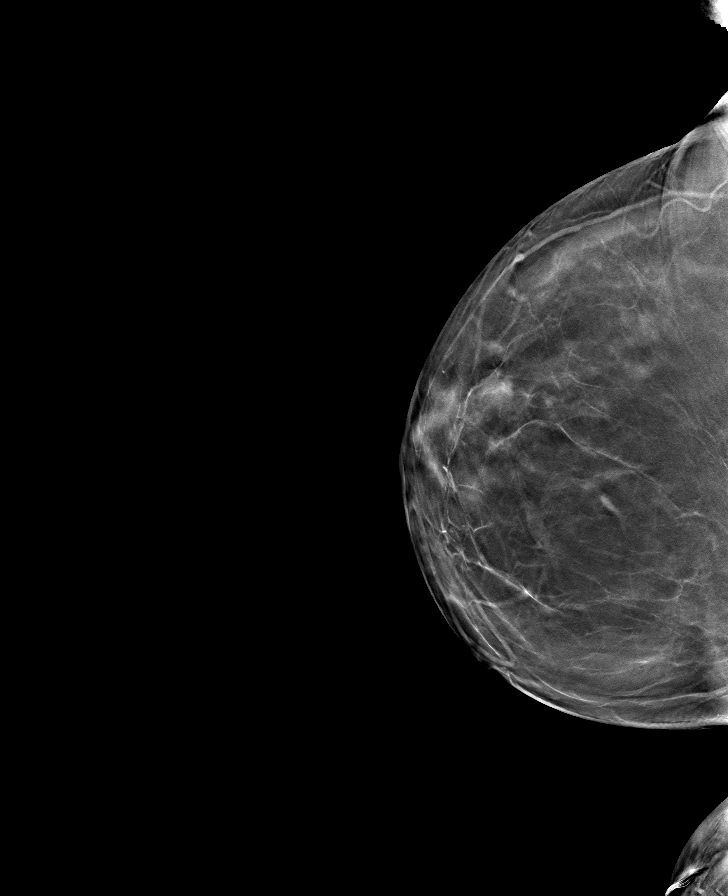

[L CC tomo · tomo slice 36/71.0]
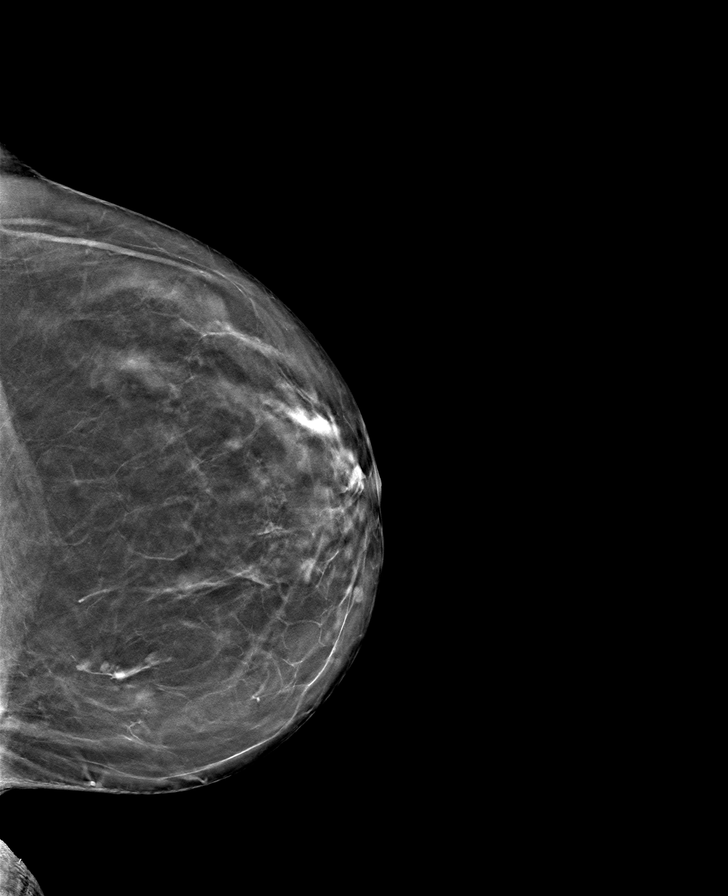

[L MLO tomo · tomo slice 37/73.0]
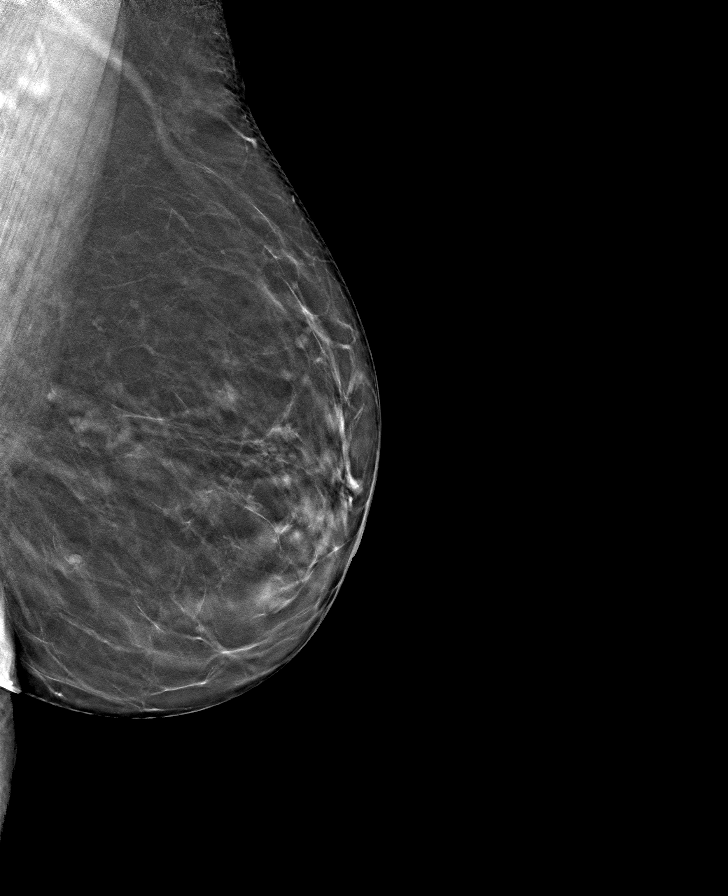

[8 of 24 positions shown; findings below may reference images not displayed]

IMPRESSION: (BI-RADS 2) Benign findings. Routine mammographic follow-up is recommended. 

MAMMOGRAPHY SCREENING BILATERAL 3ILIZA TOTI, 08/12/2022 [DATE]: 
CLINICAL INDICATION: Encounter for screening mammogram.
FINDINGS: No suspicious mass, calcifications, or area of architectural 
distortion in the left breast. Interval development of a circumscribed 11 x 7 mm 
lesion within the upper outer right breast approximately 3 cm from the nipple.
IMPRESSION: Recommend ultrasound evaluation of the right breast. 
(BI-RADS 0) Incomplete. Further evaluation will be performed as discussed above 
and the results will be reported separately.

## 2023-06-01 IMAGING — MR MRI LUMBAR SPINE WITHOUT CONTRAST
4 of 8 series · 6 of 48 positions shown · IV contrast (gadolinium)
Comparison: None

________________________________________________________________________________________________ 
MRI LUMBAR SPINE WITHOUT CONTRAST, 06/01/2023 [DATE]: 
CLINICAL INDICATION: Radiculopathy, lumbosacral region , right-sided sciatica.
TECHNIQUE: Multiplanar, multiecho position MR images of the lumbar spine were 
performed without intravenous gadolinium enhancement. Patient was scanned on a 
3T magnet

[Series 101: survey · axial · 10.0mm · 1.39mm/px · 1 of 9 slices shown]
[im 1/9]
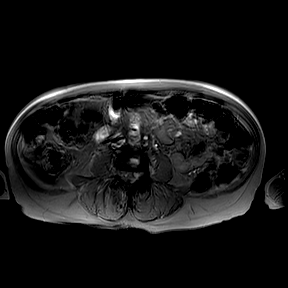

[Series 201: t2w_cor-surv · coronal · 6.0mm · 0.50mm/px · 1 of 5 slices shown]
[im 1/5]
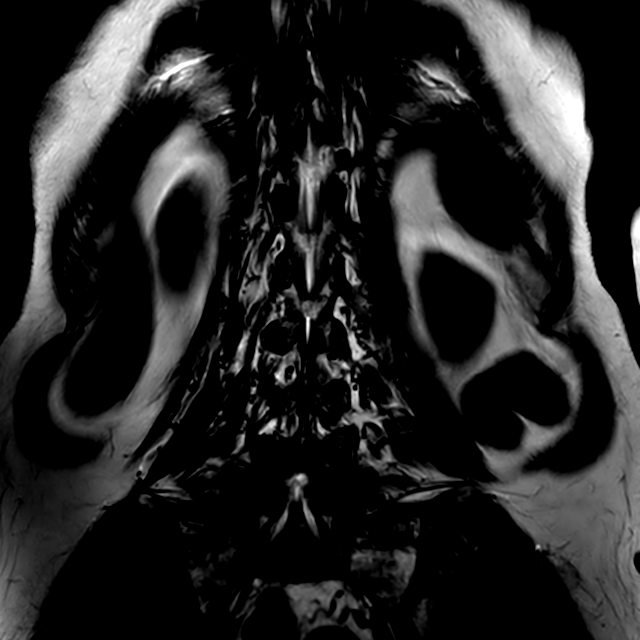

[Series 301: t1w_tse sag · sagittal · 4.0mm · 0.25mm/px · 2 of 17 slices shown]
[im 1/17]
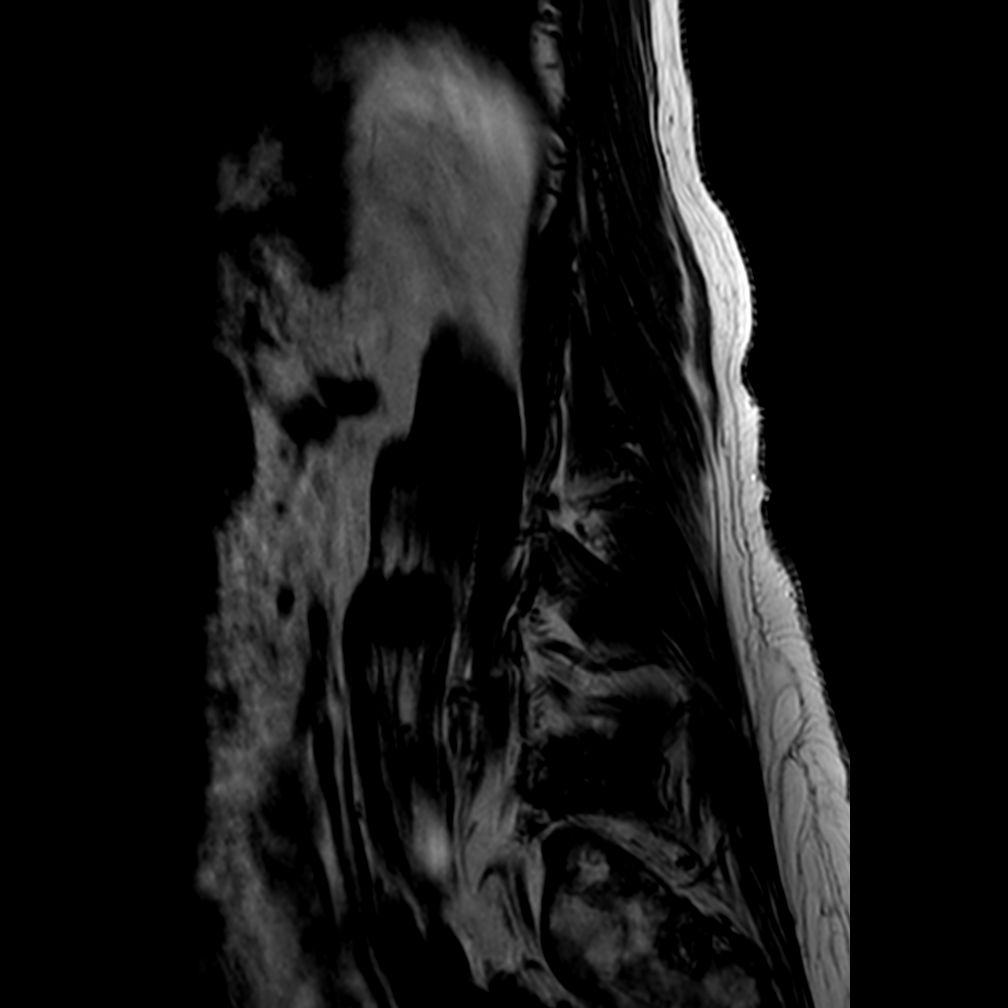
[im 17/17]
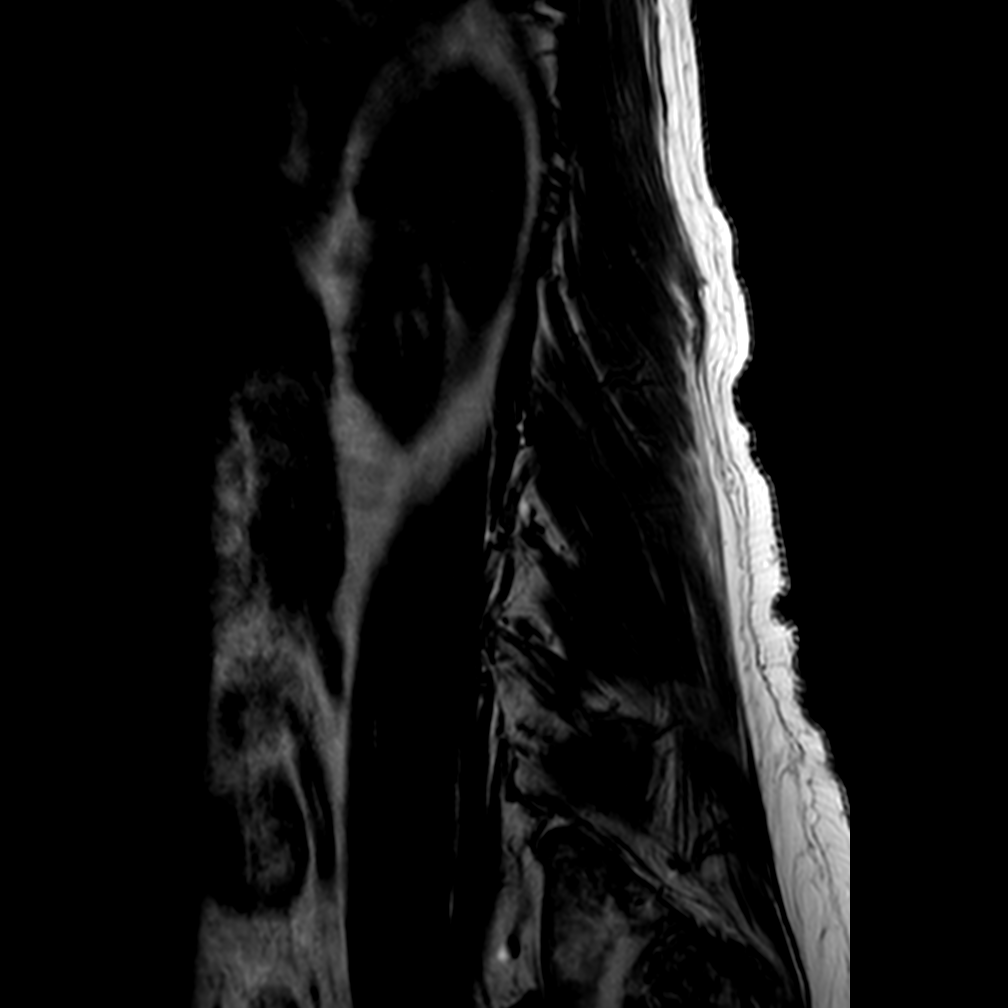

[Series 401: t2w_tse sag · sagittal · 4.0mm · 0.24mm/px · 2 of 17 slices shown]
[im 1/17]
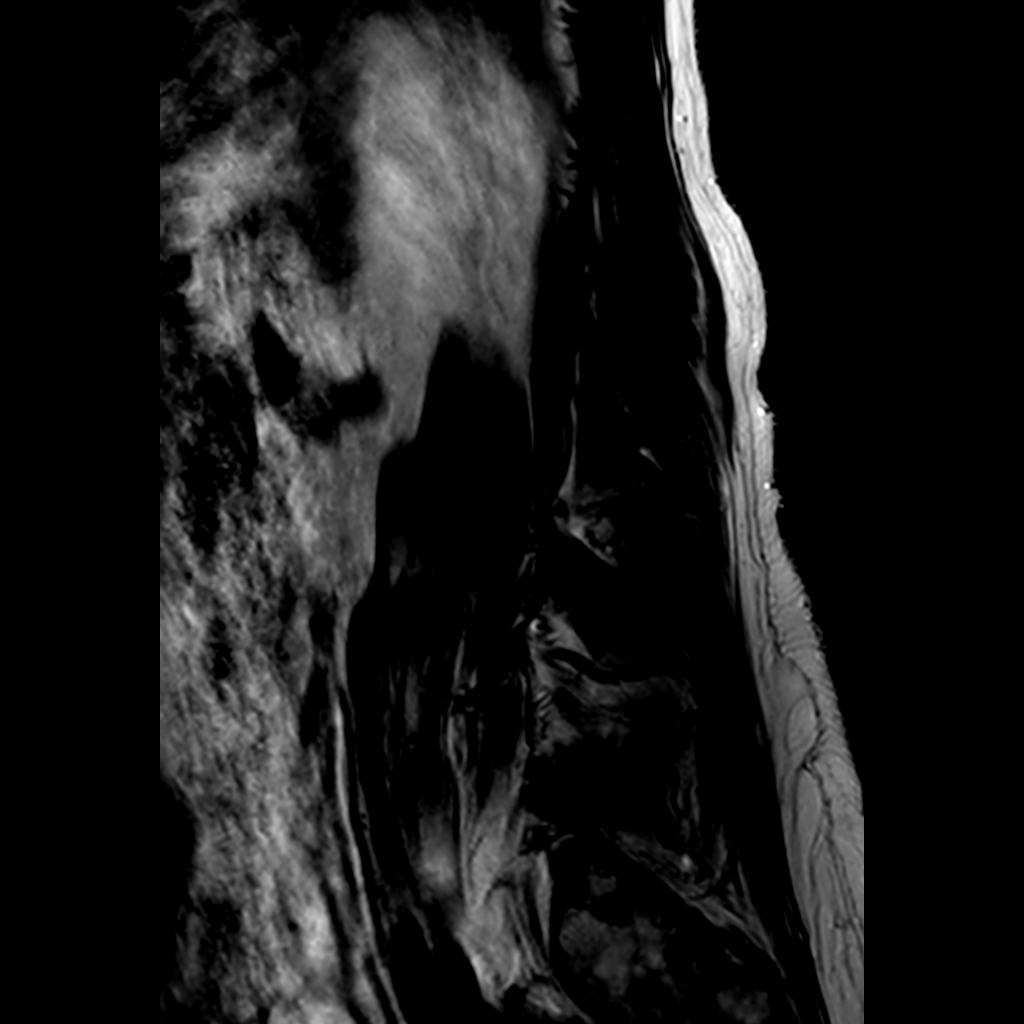
[im 9/17]
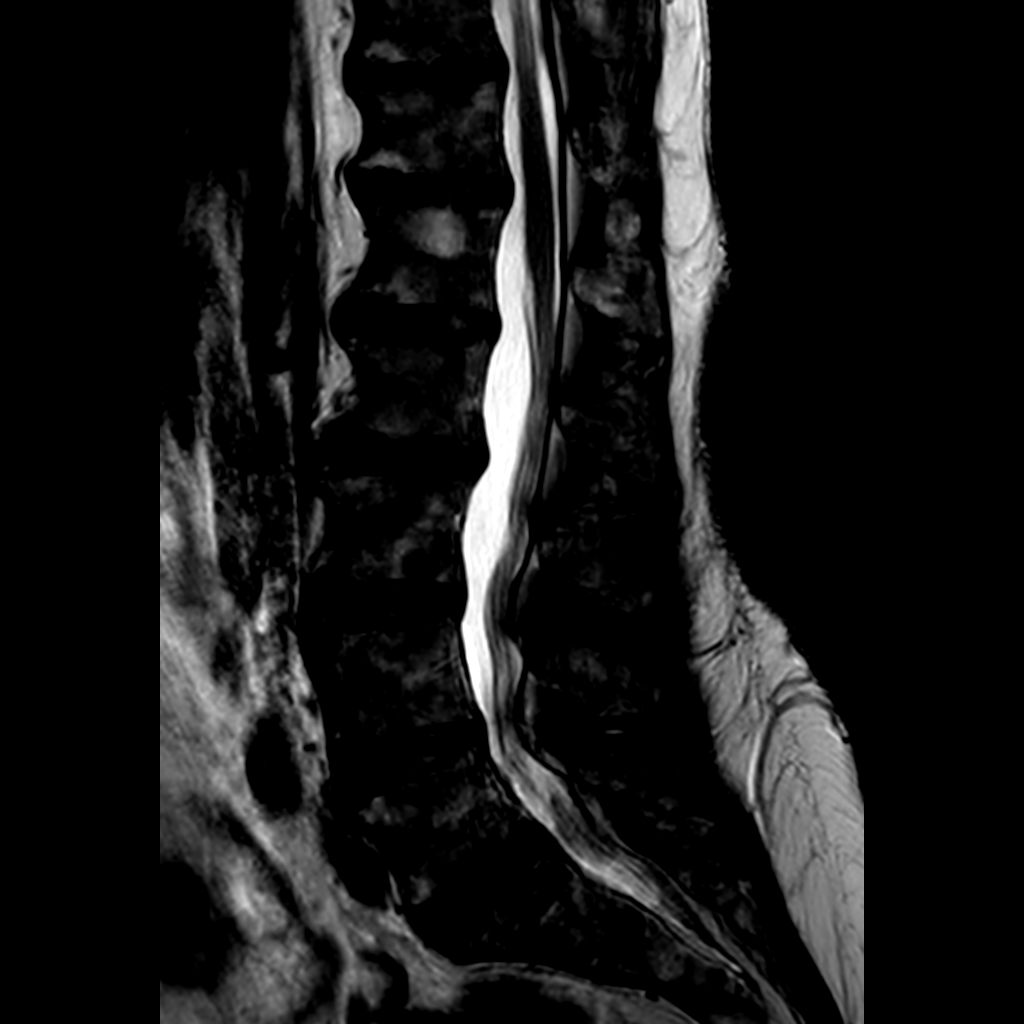

[6 of 48 positions shown; findings below may reference images not displayed]

FINDINGS: -------------------------------------------------------------------------------- 
------ 
GENERAL: 
    5 lumbar type vertebral bodies. 
ALIGNMENT: Mild levoconvex thoracic lumbar scoliosis. Grade 1 retrolisthesis L2 
on L3 and L3 on L4. 
VERTEBRAL BODY HEIGHT: Minimal chronic appearing anterior wedging T12.  
MARROW SIGNAL: There is diffuse marrow heterogeneity, nonspecific, but may 
reflect marrow changes of aging. Numerous vertebral body hemangiomas. Specific 
hemangiomas are noted in T12, L1, and L2 and L3 vertebral bodies. Hypointense 
lesion in the inferior aspect of L3. Indeterminate. While this may reflect a 
Schmorls node, there is relatively little loss of disc height at this level. 
Similar lesion in the posterior aspect of L4. 
CORD SIGNAL: Normal distal spinal cord and cauda equina. Conus medullaris 
terminates at L1-L2. 
ADDITIONAL FINDINGS: None. 
Modic I-II: L1-L2, L5-S1. 
Ligamentum Flavum > 2.5 mm: All levels. 
-------------------------------------------------------------------------------- 
------ 
SEGMENTAL: 
T12-L1: Slight loss of disc signal. Schmorls nodes. Canal and foramina are 
patent. Mild facet arthropathy. 
L1-L2: Slight loss of disc signal. Schmorls nodes. Minimal annular bulge. Canal 
and foramina are patent. Normal facets. 
L2-L3: Loss of disc height and signal with Schmorls nodes. Canal patent. 
Minimal annular bulge. Foramina patent. Bilateral facet joint effusions. 
L3-L4: Possible Schmorls node. Loss of disc height and signal. Mild lateral 
recess narrowing bilaterally with borderline canal stenosis. Facet arthropathy 
and small right facet joint effusion. Left foraminal disc protrusion with mild 
to moderate left foraminal narrowing. Right foramen patent. Left far lateral 
annular fissure. 
L4-L5: Posterior loss of disc height. Loss of disc signal. Possible Schmorls 
node. Dorsal epidural lipomatosis with mild to moderate canal stenosis and 
moderate right and mild left lateral recess narrowing. Mild to moderate right 
foraminal narrowing. Mild left foraminal narrowing. Facet arthropathy. 
L5-S1: Loss of disc signal. Mild diffuse annular bulge. Canal patent with mild 
bilateral foraminal narrowing. Facet arthropathy and small right facet joint 
effusion. 
-------------------------------------------------------------------------------- 
------
IMPRESSION: The patient was unable to finish portions of this study, including the axial T1 
pre and post imaging. No contrast was administered due to pain. The patient 
could not finish the MRI. Mild motion artifact also degrades this study. 
There is diffuse marrow heterogeneity, nonspecific, but may reflect marrow 
changes of aging. Hypointense lesions in the inferior aspect of L3 and L4 are 
technically indeterminate. While these may be degenerative related to Schmorls 
nodes, correlate for any history of malignancy and consider correlation with 
bone scan. 
Variable multilevel degenerative and scoliotic changes. Most significant canal 
stenosis is at L4-L5, mild to moderate. Variable multilevel lateral recess and 
foraminal narrowing as above.

## 2023-06-09 IMAGING — NM BONE SCAN WHOLE BODY
1 series · 2 of 2 positions shown · non-contrast
Comparison: 06/01/2023 MRI lumbar spine

________________________________________________________________________________________________ 
BONE SCAN WHOLE BODY, 06/09/2023 [DATE]: 
CLINICAL INDICATION: Low Back Pain, Unspecified
TECHNIQUE: Following the injection of 24.6 mCi of Ec-55m medronate scanning of 
the entire skeleton was performed.

[Series 1000: wholebody · 2.40mm/px · 2 of 2 frames shown]
[frame 1/2]
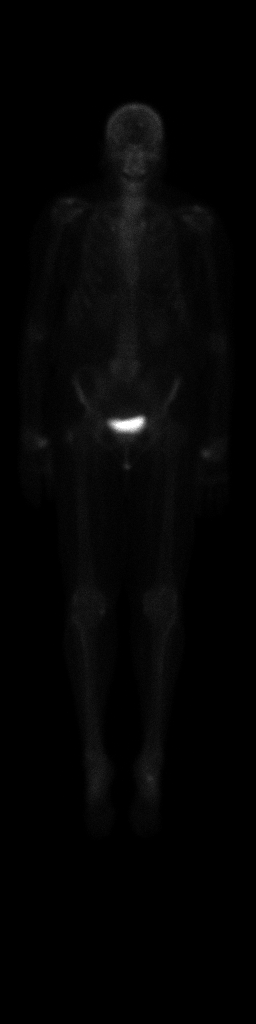
[frame 2/2]
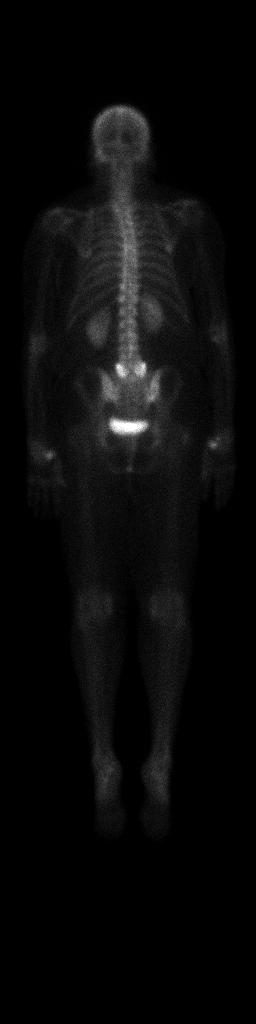

[2 of 2 positions shown; findings below may reference images not displayed]

FINDINGS: Physiologic uptake of radiotracer. No abnormal uptake in the L3-L4 
vertebrae corresponding to MRI. Bilateral L5/S1 increased radiotracer uptake 
corresponds to degenerative change with marked facet arthropathy. Mild thoracic 
dextrocurve. Tiny indeterminate focus of radiotracer uptake in the left frontal 
calvarium. Degenerative uptake in the thumb carpometacarpal (THT-E) joints, 
knees and feet.
IMPRESSION: 1.  No abnormal uptake in the L3-L4 vertebrae corresponding to MRI.  
2.  Multifocal degenerative uptake, most marked at L5/S1.  
3.  Tiny indeterminate focus of radiotracer uptake in the left frontal 
calvarium.
